# Patient Record
Sex: Female | Born: 1940 | Race: White | Hispanic: No | Marital: Married | Smoking: Never smoker
Health system: Southern US, Community
[De-identification: ages and names within clinical notes are randomized; demographics above are authoritative.]

## PROBLEM LIST (undated history)

## (undated) DIAGNOSIS — K219 Gastro-esophageal reflux disease without esophagitis: Secondary | ICD-10-CM

## (undated) DIAGNOSIS — E876 Hypokalemia: Secondary | ICD-10-CM

## (undated) DIAGNOSIS — F0390 Unspecified dementia without behavioral disturbance: Secondary | ICD-10-CM

## (undated) DIAGNOSIS — F419 Anxiety disorder, unspecified: Secondary | ICD-10-CM

## (undated) DIAGNOSIS — I10 Essential (primary) hypertension: Secondary | ICD-10-CM

## (undated) DIAGNOSIS — F32A Depression, unspecified: Secondary | ICD-10-CM

## (undated) DIAGNOSIS — F329 Major depressive disorder, single episode, unspecified: Secondary | ICD-10-CM

## (undated) DIAGNOSIS — S72009A Fracture of unspecified part of neck of unspecified femur, initial encounter for closed fracture: Secondary | ICD-10-CM

---

## 1998-04-02 ENCOUNTER — Ambulatory Visit (HOSPITAL_COMMUNITY): Admission: RE | Admit: 1998-04-02 | Discharge: 1998-04-02 | Payer: Self-pay

## 1998-04-12 ENCOUNTER — Other Ambulatory Visit: Admission: RE | Admit: 1998-04-12 | Discharge: 1998-04-12 | Payer: Self-pay | Admitting: Gynecology

## 1999-05-21 ENCOUNTER — Ambulatory Visit (HOSPITAL_COMMUNITY): Admission: RE | Admit: 1999-05-21 | Discharge: 1999-05-21 | Payer: Self-pay | Admitting: *Deleted

## 2000-05-21 ENCOUNTER — Ambulatory Visit (HOSPITAL_COMMUNITY): Admission: RE | Admit: 2000-05-21 | Discharge: 2000-05-21 | Payer: Self-pay

## 2000-08-11 ENCOUNTER — Ambulatory Visit (HOSPITAL_COMMUNITY): Admission: RE | Admit: 2000-08-11 | Discharge: 2000-08-11 | Payer: Self-pay | Admitting: Gastroenterology

## 2001-06-23 ENCOUNTER — Ambulatory Visit (HOSPITAL_COMMUNITY): Admission: RE | Admit: 2001-06-23 | Discharge: 2001-06-23 | Payer: Self-pay | Admitting: Obstetrics & Gynecology

## 2002-08-29 ENCOUNTER — Ambulatory Visit (HOSPITAL_COMMUNITY): Admission: RE | Admit: 2002-08-29 | Discharge: 2002-08-29 | Payer: Self-pay | Admitting: *Deleted

## 2006-12-29 ENCOUNTER — Encounter (HOSPITAL_BASED_OUTPATIENT_CLINIC_OR_DEPARTMENT_OTHER): Admission: RE | Admit: 2006-12-29 | Discharge: 2007-01-26 | Payer: Self-pay | Admitting: Surgery

## 2007-02-01 ENCOUNTER — Encounter (HOSPITAL_BASED_OUTPATIENT_CLINIC_OR_DEPARTMENT_OTHER): Admission: RE | Admit: 2007-02-01 | Discharge: 2007-05-02 | Payer: Self-pay | Admitting: Surgery

## 2008-01-27 ENCOUNTER — Emergency Department (HOSPITAL_COMMUNITY): Admission: EM | Admit: 2008-01-27 | Discharge: 2008-01-27 | Payer: Self-pay | Admitting: Emergency Medicine

## 2008-08-05 ENCOUNTER — Emergency Department (HOSPITAL_COMMUNITY): Admission: EM | Admit: 2008-08-05 | Discharge: 2008-08-05 | Payer: Self-pay | Admitting: Emergency Medicine

## 2009-11-23 ENCOUNTER — Emergency Department (HOSPITAL_COMMUNITY): Admission: EM | Admit: 2009-11-23 | Discharge: 2009-11-23 | Payer: Self-pay | Admitting: Emergency Medicine

## 2010-05-06 LAB — CBC
HCT: 35.9 % — ABNORMAL LOW (ref 36.0–46.0)
Hemoglobin: 11.8 g/dL — ABNORMAL LOW (ref 12.0–15.0)
MCV: 89.4 fL (ref 78.0–100.0)
RDW: 13.5 % (ref 11.5–15.5)

## 2010-05-06 LAB — COMPREHENSIVE METABOLIC PANEL
Alkaline Phosphatase: 78 U/L (ref 39–117)
BUN: 10 mg/dL (ref 6–23)
Creatinine, Ser: 0.78 mg/dL (ref 0.4–1.2)
Glucose, Bld: 94 mg/dL (ref 70–99)
Potassium: 3.3 mEq/L — ABNORMAL LOW (ref 3.5–5.1)
Total Bilirubin: 0.5 mg/dL (ref 0.3–1.2)
Total Protein: 7 g/dL (ref 6.0–8.3)

## 2010-05-06 LAB — DIFFERENTIAL
Basophils Absolute: 0.1 10*3/uL (ref 0.0–0.1)
Basophils Relative: 1 % (ref 0–1)
Eosinophils Absolute: 0.1 10*3/uL (ref 0.0–0.7)
Lymphocytes Relative: 33 % (ref 12–46)
Monocytes Relative: 8 % (ref 3–12)
Neutrophils Relative %: 57 % (ref 43–77)

## 2010-05-06 LAB — URINALYSIS, ROUTINE W REFLEX MICROSCOPIC
Glucose, UA: NEGATIVE mg/dL
Hgb urine dipstick: NEGATIVE
Ketones, ur: 15 mg/dL — AB
Protein, ur: NEGATIVE mg/dL

## 2010-05-06 LAB — URINE CULTURE
Colony Count: NO GROWTH
Culture: NO GROWTH

## 2010-06-04 NOTE — Assessment & Plan Note (Signed)
[facility]   NAME:  Jillian King, Jillian King                ACCOUNT NO.:  [id number]   MEDICAL RECORD NO.:  [id number]      DATE OF BIRTH:  May 09, 1940   PHYSICIAN:  Theresia Majors. Tanda Rockers, M.D. VISIT DATE:  12/30/2006                                   OFFICE VISIT   REASON FOR CONSULTATION:  Jillian King is a 70 year old lady who was  referred by Dr. Rudi Heap for recurrent MRSA infections over the  buttocks and the right lower extremity.   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   RECOMMENDATIONS:  Will change the patient's bathing technique to include  a tub bath daily, thereafter with rinse of Dakin's solution.  We will  reevaluate the patient in 2 weeks to reassess her response to therapy.   SUBJECTIVE:  Jillian King is a 70 year old lady who is currently living  with her daughter-in-law.  Over the past 3-4 weeks, she has had  recurrent pustules on both buttocks, and most recently on the posterior  right lower extremity.  These pustules have grown MRSA in the past, but  most recently were negative for MRSA following a course of doxycycline,  Septra and most recently rifampin.  She has not been febrile.  She  continues to be ambulatory.  Her diet is poor according to her daughter.   ALLERGIES:  The patient denies allergies.   CURRENT MEDICATIONS:  1. Lexapro 40 mg daily.  2. Aricept 5 mg daily.  3. Namenda 10 mg b.i.d.  4. Risperdal 1 mg t.i.d.  5. Xanax 0.5 mg t.i.d.  6. Tylenol p.r.n.  7. Restoril 30 mg q.h.s. p.r.n.   PREVIOUS SURGERY:  1. Hysterectomy.  2. Appendectomy.   FAMILY HISTORY:  Positive for vascular disease, cancer and diabetes.   SOCIAL HISTORY:  She is divorced.  She has 4 children who live in the  local area.  She is retired from [employer].   REVIEW OF SYSTEMS:  She specifically denies chest pain.  Chronic cough.  She has occasional constipation.   There has been no dysuria.  She has  lost approximately 10 pounds over the last year due to poor appetite.  She is currently being evaluated by a mental health officer for dementia  and symptomatology related to both dementia, as well as depression.  The  remainder of the review of systems is negative.   PHYSICAL EXAMINATION:  GENERAL:  She is a well-developed female in no  acute distress.  She is accompanied by her daughter-in-law.  VITAL SIGNS:  Blood pressure is 109/64, respirations 16, pulse rate 72,  temperature is 98.4.  HEENT:  Clear.  NECK:  Supple.  Trachea is midline.  Thyroid is nonpalpable.  LUNGS:  Clear.  ABDOMEN:  Soft.  EXTREMITIES:  Remarkable for firm eschars on the posterior gastroc  soleus area of the right lower extremity.  There is minimum erythema.  There is no fluctuance.  BUTTOCKS:  There are 3 separate areas of eschar surrounded by minimum  erythema and absolutely no drainage.  There is a full-thickness area  that is moist but a debridement was not indicated.  The pedal pulses are  3+, readily palpable.  On the posterior aspect of the  right lower  extremity, several areas of well-formed eschar are present.  There is no  evidence of lymphangitis or abscess formation.  NEUROLOGIC:  The patient moves all fours.   DISCUSSION:  Jillian King is a 70 year old lady who reports a history of  these recurrent pustules which have been positive for methicillin-  resistant Staphylococcus aureus.  We have counseled the patient and her  daughter-in-law that the mainstay of the prevention of MRSA is  antiseptic soap wash.  We have instructed the patient and her daughter-  in-law to use a daily shower with proprietary antiseptic soap followed  by a mixture of Dakin's solution to be made using 1 tablespoon of Clorox  in one quart of tap water.  This solution will be used as a rinse of all  of the areas following the daily bath.  She will continue this for 2  weeks and be  reevaluated at the wound center.  If in the event she  develops increasing redness or pain in these areas, she should call the  clinic for further instructions and to be reevaluated.  We have given  the patient and the daughter-in-law an opportunity to ask questions.  They express an understanding of these instructions and indicate that  they will be compliant.  They express gratitude for having been seen in  the clinic.      Harold A. Tanda Rockers, M.D.  Electronically Signed     HAN/MEDQ  D:  12/30/2006  T:  12/31/2006  Job:  [id number]   cc:   Ernestina Penna, M.D.

## 2010-06-04 NOTE — Assessment & Plan Note (Signed)
[facility]   NAME:  Jillian King, Jillian King                ACCOUNT NO.:  [id number]   MEDICAL RECORD NO.:  [id number]      DATE OF BIRTH:  Sep 19, 1940   PHYSICIAN:  Jonelle Sports. Sevier, M.D.  VISIT DATE:  01/12/2007                                   OFFICE VISIT   WOUND CENTER PROGRESS NOTE:  This is the second visit for this 70-year-  old white female who was seen a week ago for recurring MRSA lesions on  the buttock area and also on the posterior aspect of the right lower  extremity.  At that time it was recommended to her that she simply bathe  with Dial soap and that she rinse herself with a homemade Dakin's  solution, not just areas of involvement, but actually her entire body.  It was explained that she is likely a carrier of the MRSA (to my  knowledge her nostrils have not been cultured) and hopefully this spread  has been simply transcutaneous and not systemic.   The family reports that the lesions are somewhat better and that no new  lesions have appeared with the exception of a small almost blister like  area on the right lower quadrant of the abdomen, almost near McBurney's  point, which is described as, looking like these things always appear  with they are first beginning.  She has had no fever, chills or  systemic symptoms.   PHYSICAL EXAMINATION:  VITAL SIGNS:  Blood pressure is 123/72, pulse 74  regular, respirations 18, and temperature 97.5.  BUTTOCK:  There is one large lesion on the right buttock measuring 1.5 x  2.6 centimeters.  It is somewhat concave in appearance and is totally  filled with eschar slightly crumbly edges.   On the posterior aspect of the right lower leg are several reddened  areas and several scabbed areas of about 3 each with no spreading  erythema.  No swelling and so forth, which again, appear to be stable or  satisfactory.   IMPRESSION:  Multiple cutaneous staphylococcal lesions (MRSA) stable to  slightly improved.   DISPOSITION:  A long conversation was held with the patient and her  daughter-in-law.  They need to understand that we will prefer to void  systemic antibiotics if possible, particularly if it appears that all of  the spread is indeed transcutaneous.  They are also advise that it will  be a very slow healing process, but that the best thing we could do is  to allow that to happen and for neither [place] nor she to, pick at these  areas.   Accordingly she was advised to continue her daily cleansing with  antibiotic soap and is to then rinse her entire body with a homemade  Dakin's solution as well.   The daughter is concerned about her itching at night.  It is recommend  that she put A&D ointment on these areas of these lesions, cover them  with a small piece of saran warp and hold that in place with paper tape  in the case of the buttock or a circumferential Kling wrap in the case  of the leg.  This hopefully will keep her from further irritating it.   In addition, it is recommended that they  consider getting some soft  white cotton gloves for her to wear at night, and if necessary, if her  tendency to pick at her nose and the scratch is great during the day is  to actually consider using them during the day as well.   The tiny area on the right lower abdominal wall is investigated, does  appear to be loosening of the outer layer of the skin, in sort of a  blister-like formation, although, there is no tense fluid accumulation.  There is no surrounding erythema.  My recommendation at this point is  simply to observe this lesion over time as well.   Return visit here will be in 3 weeks.           ______________________________  Jonelle Sports Cheryll Cockayne, M.D.     RES/MEDQ  D:  01/12/2007  T:  01/12/2007  Job:  [id number]

## 2010-08-16 ENCOUNTER — Emergency Department (HOSPITAL_COMMUNITY)
Admission: EM | Admit: 2010-08-16 | Discharge: 2010-08-16 | Disposition: A | Discharge status: Home / Self Care | Payer: Medicare Other | Attending: Emergency Medicine | Admitting: Emergency Medicine

## 2010-08-16 ENCOUNTER — Emergency Department (HOSPITAL_COMMUNITY): Payer: Medicare Other

## 2010-08-16 DIAGNOSIS — Y9210 Unspecified residential institution as the place of occurrence of the external cause: Secondary | ICD-10-CM | POA: Insufficient documentation

## 2010-08-16 DIAGNOSIS — G309 Alzheimer's disease, unspecified: Secondary | ICD-10-CM | POA: Insufficient documentation

## 2010-08-16 DIAGNOSIS — F341 Dysthymic disorder: Secondary | ICD-10-CM | POA: Insufficient documentation

## 2010-08-16 DIAGNOSIS — F0280 Dementia in other diseases classified elsewhere without behavioral disturbance: Secondary | ICD-10-CM | POA: Insufficient documentation

## 2010-08-16 DIAGNOSIS — Z79899 Other long term (current) drug therapy: Secondary | ICD-10-CM | POA: Insufficient documentation

## 2010-08-16 DIAGNOSIS — K219 Gastro-esophageal reflux disease without esophagitis: Secondary | ICD-10-CM | POA: Insufficient documentation

## 2010-08-16 DIAGNOSIS — Z7982 Long term (current) use of aspirin: Secondary | ICD-10-CM | POA: Insufficient documentation

## 2010-08-16 DIAGNOSIS — W19XXXA Unspecified fall, initial encounter: Secondary | ICD-10-CM | POA: Insufficient documentation

## 2010-11-01 ENCOUNTER — Emergency Department (HOSPITAL_COMMUNITY)
Admission: EM | Admit: 2010-11-01 | Discharge: 2010-11-01 | Disposition: A | Discharge status: Home / Self Care | Payer: Medicare Other | Attending: Emergency Medicine | Admitting: Emergency Medicine

## 2010-11-01 DIAGNOSIS — W19XXXA Unspecified fall, initial encounter: Secondary | ICD-10-CM | POA: Insufficient documentation

## 2010-11-01 DIAGNOSIS — Y9210 Unspecified residential institution as the place of occurrence of the external cause: Secondary | ICD-10-CM | POA: Insufficient documentation

## 2010-11-01 DIAGNOSIS — K219 Gastro-esophageal reflux disease without esophagitis: Secondary | ICD-10-CM | POA: Insufficient documentation

## 2010-11-01 DIAGNOSIS — F0390 Unspecified dementia without behavioral disturbance: Secondary | ICD-10-CM | POA: Insufficient documentation

## 2010-11-01 HISTORY — DX: Anxiety disorder, unspecified: F41.9

## 2010-11-01 HISTORY — DX: Gastro-esophageal reflux disease without esophagitis: K21.9

## 2010-11-01 HISTORY — DX: Hypokalemia: E87.6

## 2010-11-01 HISTORY — DX: Fracture of unspecified part of neck of unspecified femur, initial encounter for closed fracture: S72.009A

## 2010-11-01 HISTORY — DX: Major depressive disorder, single episode, unspecified: F32.9

## 2010-11-01 HISTORY — DX: Unspecified dementia, unspecified severity, without behavioral disturbance, psychotic disturbance, mood disturbance, and anxiety: F03.90

## 2010-11-01 HISTORY — DX: Depression, unspecified: F32.A

## 2010-11-01 NOTE — ED Provider Notes (Signed)
Scribed for [employer]. Rubin Payor, MD, the patient was seen in room APA03/APA03. This chart was scribed by [employer]. The patient's care started at 17:49  CSN: [id number] Arrival date & time: 11/01/2010  4:34 PM  Chief Complaint  Patient presents with  . Fall   HPI Level 5 Caveat for Dementia Jillian King is a 70 y.o. female who presents to the Emergency Department complaining of a fall. Per Nurse's notes, patient was brought to the ER from the nursing home with complaint of a fall. Collection of patient history limited by dementia.  Past Medical History  Diagnosis Date  . Dementia   . GERD (gastroesophageal reflux disease)   . Hypokalemia   . Hip fracture   . Anxiety   . Depression     No past surgical history on file.  No family history on file.  History  Substance Use Topics  . Smoking status: Not on file  . Smokeless tobacco: Not on file  . Alcohol Use:     OB History    Grav Para Term Preterm Abortions TAB SAB Ect Mult Living                  Review of Systems  All other systems reviewed and are negative.    Allergies  Review of patient's allergies indicates no known allergies.  Home Medications  No current outpatient prescriptions on file.  BP 152/103  Pulse 75  Resp 20  Physical Exam  Nursing note and vitals reviewed. Constitutional: She appears well-developed and well-nourished. No distress.       Awake, alert, nontoxic appearance.  HENT:  Head: Normocephalic and atraumatic.  Mouth/Throat: Oropharynx is clear and moist. No oropharyngeal exudate.  Eyes: Conjunctivae are normal. Right eye exhibits no discharge. Left eye exhibits no discharge.  Neck: Neck supple. No tracheal deviation present.  Cardiovascular: Normal rate and normal heart sounds.   No murmur heard. Pulmonary/Chest: Effort normal. She exhibits no tenderness.  Abdominal: Soft. There is no tenderness. There is no rebound and no guarding.  Musculoskeletal: She exhibits no  tenderness.       Baseline ROM, no obvious new focal weakness.  Lymphadenopathy:    She has no cervical adenopathy.  Neurological: She is alert.       Demented but pleasant  Skin: Skin is warm and dry. No rash noted.  Psychiatric: She has a normal mood and affect.    ED Course  Procedures     COORDINATION OF CARE:  18:10 - EDP examined patient at bedside and ordered the following No orders of the defined types were placed in this encounter.      I personally performed the services described in this documentation, which was scribed in my presence. The recorded information has been reviewed and considered.  Patient was sent in from the nursing home for a fall. Was an unwitnessed fall. She is reportedly at her baseline dementia there is no evidence of trauma on her. there's no bruising or deformity to her head , or tenderness to her neck or back. She is moving all extremities doesn't appear that she needs imaging right now. she is not on any blood thinners. She will be discharged back to the nursing home.  Juliet Rude. Rubin Payor, MD 11/01/10 1816

## 2010-11-01 NOTE — ED Notes (Signed)
Pt left with no stated needs with nh staff

## 2010-11-01 NOTE — ED Notes (Signed)
Staff sitting with pt at this time. nad noted.

## 2010-11-01 NOTE — ED Notes (Signed)
Pt is still waiting to be seen by provider. NT at bedside sitting with pt for safety.

## 2010-11-01 NOTE — ED Notes (Signed)
Spent 20 minutes attempting to get someone from [place] [facility] to pick up patient. Spoke with staff member and they stated it would be 1 1/2 hours before anyone could come and get pt because supervisor lives in [place] and would have to come and get the transportation vehicle first. I explained that we needed a number that we could get in touch with staff in case of emergency in future. Staff member refused to give additional number and stated no one could be here any sooner to get pt.

## 2010-11-01 NOTE — ED Notes (Signed)
Pt was brought from [facility] for fall. Staff unsure what pt hit when she fell. Un witnessed. Pt has dementia and non cooperative per nursing home report. Pt attempted to get oob at this time. Charge made aware for sitter for pt.

## 2010-12-02 ENCOUNTER — Encounter (HOSPITAL_COMMUNITY): Payer: Self-pay

## 2010-12-02 ENCOUNTER — Other Ambulatory Visit: Payer: Self-pay

## 2010-12-02 ENCOUNTER — Emergency Department (HOSPITAL_COMMUNITY)
Admission: EM | Admit: 2010-12-02 | Discharge: 2010-12-03 | Disposition: A | Discharge status: Home / Self Care | Payer: Medicare Other | Attending: Emergency Medicine | Admitting: Emergency Medicine

## 2010-12-02 DIAGNOSIS — R1110 Vomiting, unspecified: Secondary | ICD-10-CM | POA: Insufficient documentation

## 2010-12-02 DIAGNOSIS — F0390 Unspecified dementia without behavioral disturbance: Secondary | ICD-10-CM | POA: Insufficient documentation

## 2010-12-02 DIAGNOSIS — N390 Urinary tract infection, site not specified: Secondary | ICD-10-CM | POA: Insufficient documentation

## 2010-12-02 DIAGNOSIS — R55 Syncope and collapse: Secondary | ICD-10-CM | POA: Insufficient documentation

## 2010-12-02 DIAGNOSIS — K219 Gastro-esophageal reflux disease without esophagitis: Secondary | ICD-10-CM | POA: Insufficient documentation

## 2010-12-02 LAB — URINE MICROSCOPIC-ADD ON

## 2010-12-02 LAB — URINALYSIS, ROUTINE W REFLEX MICROSCOPIC
Glucose, UA: NEGATIVE mg/dL
Protein, ur: NEGATIVE mg/dL
Specific Gravity, Urine: 1.012 (ref 1.005–1.030)
pH: 7 (ref 5.0–8.0)

## 2010-12-02 LAB — POCT I-STAT, CHEM 8
Calcium, Ion: 1.24 mmol/L (ref 1.12–1.32)
Chloride: 104 mEq/L (ref 96–112)
HCT: 35 % — ABNORMAL LOW (ref 36.0–46.0)
Potassium: 4.4 mEq/L (ref 3.5–5.1)
Sodium: 142 mEq/L (ref 135–145)

## 2010-12-02 NOTE — ED Notes (Signed)
Patient continues on monitor, patient was ambulated to bathroom.  Patient now back in bed.

## 2010-12-02 NOTE — ED Notes (Signed)
Patient with history of dementia, did have CPR done on her after having a syncopal episode.  Patient is now per baseline due to dementia.

## 2010-12-02 NOTE — ED Notes (Signed)
Pt from [facility] assisted living for near syncopal episode on commode and vomited and diarrhea found unresponsive and staff started CPR pt awake and normal per EMS on arrival.  No complaints per pt.

## 2010-12-03 LAB — TROPONIN I: Troponin I: <0.3 ng/mL (ref <0.30)

## 2010-12-03 MED ORDER — CEFUROXIME AXETIL 500 MG PO TABS: 500.0000 mg | ORAL_TABLET | Freq: Two times a day (BID) | ORAL | Status: AC

## 2010-12-03 MED ORDER — CEFUROXIME AXETIL 500 MG PO TABS
500.0000 mg | ORAL_TABLET | Freq: Two times a day (BID) | ORAL | Status: DC
  Administered 2010-12-03: 500 mg via ORAL
  Filled 2010-12-03 (×2): qty 1

## 2010-12-03 NOTE — ED Notes (Signed)
Report called to [facility].  Patient to have a ride come and

## 2010-12-03 NOTE — ED Notes (Signed)
Patient discharged in care of drivers for [facility] home

## 2010-12-03 NOTE — ED Provider Notes (Signed)
History     CSN: [id number] Arrival date & time: 12/02/2010  7:39 PM   First MD Initiated Contact with Patient 12/02/10 2031      Chief Complaint  Patient presents with  . Near Syncope   level VCaveat patient demented history is obtained via telephone from Ms. Aundra Millet, medical technician an assisted-living facility. Patient had a syncopal event tonight during which time she was unconscious for 2-3 minutes. Upon reawakening she vomited one time. Presently patient denies pain anywhere  (Consider location/radiation/quality/duration/timing/severity/associated sxs/prior treatment) HPI  Past Medical History  Diagnosis Date  . Dementia   . GERD (gastroesophageal reflux disease)   . Hypokalemia   . Hip fracture   . Anxiety   . Depression     History reviewed. No pertinent past surgical history.  History reviewed. No pertinent family history.  History  Substance Use Topics  . Smoking status: Not on file  . Smokeless tobacco: Not on file  . Alcohol Use:     OB History    Grav Para Term Preterm Abortions TAB SAB Ect Mult Living                  Review of Systems  Unable to perform ROS: Dementia    Allergies  Review of patient's allergies indicates no known allergies.  Home Medications   Current Outpatient Rx  Name Route Sig Dispense Refill  . ACETAMINOPHEN 160 MG/5ML PO SUSP Oral Take 960 mg/kg by mouth 2 (two) times daily.      Marland Kitchen ALPRAZOLAM 0.5 MG PO TABS Oral Take 0.5 mg by mouth 3 (three) times daily.      . ASPIRIN 81 MG PO CHEW Oral Chew 81 mg by mouth daily.      . BUSPIRONE HCL 5 MG PO TABS Oral Take 5 mg by mouth 3 (three) times daily.      Marland Kitchen CALCIUM CARB-CHOLECALCIFEROL 500-600 MG-UNIT PO CHEW Oral Chew 1 each by mouth 3 (three) times daily.      Marland Kitchen DIVALPROEX SODIUM 125 MG PO CPSP Oral Take 250 mg by mouth 2 (two) times daily. *May open and sprinkle capsule on food*     . MELATONIN 5 MG PO TABS Oral Take 1 tablet by mouth at bedtime.      . MELOXICAM 7.5  MG PO TABS Oral Take 7.5 mg by mouth at bedtime.      . MULTIVITAMIN & MINERAL PO LIQD Oral Take 5 mLs by mouth daily.      Marland Kitchen CARNATION INST BREAKFAST JUICE PO Oral Take 1 Can by mouth 2 (two) times daily.      Marland Kitchen OMEPRAZOLE 20 MG PO CPDR Oral Take 20 mg by mouth daily. *Open and sprinkle capsule in food*     . POLYETHYLENE GLYCOL 3350 PO POWD Oral Take 17 g by mouth 2 (two) times a week. On TUESDAYS and FRIDAYS *Mix one capful (17g) in water*     . RIVASTIGMINE TARTRATE 3 MG PO CAPS Oral Take 3 mg by mouth 2 (two) times daily.      . TRAZODONE HCL 150 MG PO TABS Oral Take 150 mg by mouth at bedtime.        BP 113/64  Pulse 68  Resp 18  SpO2 99%  Physical Exam  Nursing note and vitals reviewed. Constitutional:       Pleasantly confused no distress  HENT:  Head: Normocephalic and atraumatic.       Edentulous  Eyes: EOM are normal.  Neck: Neck supple. No tracheal deviation present. No thyromegaly present.  Cardiovascular: Normal rate and regular rhythm.   No murmur heard. Pulmonary/Chest: Effort normal and breath sounds normal.  Abdominal: Soft. Bowel sounds are normal. She exhibits no distension. There is no tenderness.  Genitourinary: Guaiac negative stool.  Musculoskeletal: Normal range of motion. She exhibits no edema and no tenderness.  Neurological: She is alert. Coordination normal.       Moves all extremities  Skin: Skin is warm and dry. No rash noted.    ED Course  Procedures (including critical care time)  Labs Reviewed  URINALYSIS, ROUTINE W REFLEX MICROSCOPIC - Abnormal; Notable for the following:    Appearance TURBID (*)    Hgb urine dipstick LARGE (*)    Leukocytes, UA LARGE (*)    All other components within normal limits  POCT I-STAT, CHEM 8 - Abnormal; Notable for the following:    Glucose, Bld 104 (*)    Hemoglobin 11.9 (*)    HCT 35.0 (*)    All other components within normal limits  URINE MICROSCOPIC-ADD ON - Abnormal; Notable for the following:     Squamous Epithelial / LPF FEW (*)    Bacteria, UA MANY (*)    All other components within normal limits  OCCULT BLOOD X 1 CARD TO LAB, STOOL  I-STAT, CHEM 8  TROPONIN I   No results found.   No diagnosis found.   Date: 12/03/2010  Rate: 75  Rhythm: normal sinus rhythm  QRS Axis: normal  Intervals: normal  ST/T Wave abnormalities: normal  Conduction Disutrbances:none  Narrative Interpretation:   Old EKG Reviewed: none available  Results for orders placed during the hospital encounter of 12/02/10  URINALYSIS, ROUTINE W REFLEX MICROSCOPIC      Component Value Range   Color, Urine YELLOW  YELLOW    Appearance TURBID (*) CLEAR    Specific Gravity, Urine 1.012  1.005 - 1.030    pH 7.0  5.0 - 8.0    Glucose, UA NEGATIVE  NEGATIVE (mg/dL)   Hgb urine dipstick LARGE (*) NEGATIVE    Bilirubin Urine NEGATIVE  NEGATIVE    Ketones, ur NEGATIVE  NEGATIVE (mg/dL)   Protein, ur NEGATIVE  NEGATIVE (mg/dL)   Urobilinogen, UA 0.2  0.0 - 1.0 (mg/dL)   Nitrite NEGATIVE  NEGATIVE    Leukocytes, UA LARGE (*) NEGATIVE   TROPONIN I      Component Value Range   Troponin I <0.30  <0.30 (ng/mL)  POCT I-STAT, CHEM 8      Component Value Range   Sodium 142  135 - 145 (mEq/L)   Potassium 4.4  3.5 - 5.1 (mEq/L)   Chloride 104  96 - 112 (mEq/L)   BUN 16  6 - 23 (mg/dL)   Creatinine, Ser 2.95  0.50 - 1.10 (mg/dL)   Glucose, Bld 621 (*) 70 - 99 (mg/dL)   Calcium, Ion 3.08  6.57 - 1.32 (mmol/L)   TCO2 28  0 - 100 (mmol/L)   Hemoglobin 11.9 (*) 12.0 - 15.0 (g/dL)   HCT 84.6 (*) 96.2 - 46.0 (%)  URINE MICROSCOPIC-ADD ON      Component Value Range   Squamous Epithelial / LPF FEW (*) RARE    WBC, UA TOO NUMEROUS TO COUNT  <3 (WBC/hpf)   RBC / HPF 7-10  <3 (RBC/hpf)   Bacteria, UA MANY (*) RARE    ED course tempted to call patient's sonMr. JohnChenausky at 11:15 PM, no answer. Message left  on answering machine  No results found. MDM  Plan prescription Ceftin, urine sent for culture Diagnosis #1  syncope #2 UTI        Doug Sou, MD 12/03/10 [phone]

## 2010-12-04 LAB — URINE CULTURE: Colony Count: >=100000

## 2010-12-05 NOTE — ED Notes (Signed)
+   Urine Patient treated with Keflex-sensitive to same-chart appended per protocol MD. 

## 2011-07-15 ENCOUNTER — Emergency Department (HOSPITAL_COMMUNITY)
Admission: EM | Admit: 2011-07-15 | Discharge: 2011-07-15 | Disposition: A | Discharge status: Home / Self Care | Payer: Medicare Other | Attending: Emergency Medicine | Admitting: Emergency Medicine

## 2011-07-15 ENCOUNTER — Encounter (HOSPITAL_COMMUNITY): Payer: Self-pay

## 2011-07-15 DIAGNOSIS — K529 Noninfective gastroenteritis and colitis, unspecified: Secondary | ICD-10-CM

## 2011-07-15 DIAGNOSIS — K219 Gastro-esophageal reflux disease without esophagitis: Secondary | ICD-10-CM | POA: Insufficient documentation

## 2011-07-15 DIAGNOSIS — R1110 Vomiting, unspecified: Secondary | ICD-10-CM | POA: Insufficient documentation

## 2011-07-15 DIAGNOSIS — F0390 Unspecified dementia without behavioral disturbance: Secondary | ICD-10-CM | POA: Insufficient documentation

## 2011-07-15 DIAGNOSIS — R197 Diarrhea, unspecified: Secondary | ICD-10-CM | POA: Insufficient documentation

## 2011-07-15 MED ORDER — ONDANSETRON HCL 4 MG/2ML IJ SOLN
4.0000 mg | Freq: Once | INTRAMUSCULAR | Status: AC
  Administered 2011-07-15: 4 mg via INTRAVENOUS
  Filled 2011-07-15: qty 2

## 2011-07-15 MED ORDER — SODIUM CHLORIDE 0.9 % IV BOLUS (SEPSIS): 500.0000 mL | Freq: Once | INTRAVENOUS | Status: DC

## 2011-07-15 NOTE — ED Provider Notes (Signed)
This chart was scribed for Donnetta Hutching, MD by Wallis Mart. The patient was seen in room APAH8/APAH8 and the patient's care was started at 9:08 PM.  CSN: [id number]  Arrival date & time 07/15/11  2029   First MD Initiated Contact with Patient 07/15/11 2049      Chief Complaint  Patient presents with  . Emesis  . Diarrhea    (Consider location/radiation/quality/duration/timing/severity/associated sxs/prior treatment) HPI  Level 5 Caveat for dementia   Jillian King is a 71 y.o. female who presents to the Emergency Department via EMS complaining of one episode of vomiting and diarrhea today per nursing notes. Pt is a resident of [facility] and does not know why she is in the ED, does not appear distressed or uncomfortable There are no other associated symptoms and no other alleviating or aggravating factors.   Past Medical History  Diagnosis Date  . Dementia   . GERD (gastroesophageal reflux disease)   . Hypokalemia   . Hip fracture   . Anxiety   . Depression     History reviewed. No pertinent past surgical history.  No family history on file.  History  Substance Use Topics  . Smoking status: Never Smoker   . Smokeless tobacco: Not on file  . Alcohol Use: No    OB History    Grav Para Term Preterm Abortions TAB SAB Ect Mult Living                  Review of Systems  10 Systems reviewed and all are negative for acute change except as noted in the HPI.    Allergies  Review of patient's allergies indicates no known allergies.  Home Medications   Current Outpatient Rx  Name Route Sig Dispense Refill  . ACETAMINOPHEN 160 MG/5ML PO SUSP Oral Take 960 mg/kg by mouth 2 (two) times daily.      Marland Kitchen ALPRAZOLAM 0.5 MG PO TABS Oral Take 0.5 mg by mouth 3 (three) times daily.      . ASPIRIN 81 MG PO CHEW Oral Chew 81 mg by mouth daily.      . BUSPIRONE HCL 5 MG PO TABS Oral Take 5 mg by mouth 3 (three) times daily.      Marland Kitchen CALCIUM  CARB-CHOLECALCIFEROL 500-600 MG-UNIT PO CHEW Oral Chew 1 each by mouth 3 (three) times daily.      Marland Kitchen DIVALPROEX SODIUM 125 MG PO CPSP Oral Take 250 mg by mouth 2 (two) times daily. *May open and sprinkle capsule on food*     . MELATONIN 5 MG PO TABS Oral Take 1 tablet by mouth at bedtime.      . MELOXICAM 7.5 MG PO TABS Oral Take 7.5 mg by mouth at bedtime.      . MULTIVITAMIN & MINERAL PO LIQD Oral Take 5 mLs by mouth daily.      Marland Kitchen CARNATION INST BREAKFAST JUICE PO Oral Take 1 Can by mouth 2 (two) times daily.      Marland Kitchen OMEPRAZOLE 20 MG PO CPDR Oral Take 20 mg by mouth daily. *Open and sprinkle capsule in food*     . POLYETHYLENE GLYCOL 3350 PO POWD Oral Take 17 g by mouth 2 (two) times a week. On TUESDAYS and FRIDAYS *Mix one capful (17g) in water*     . RIVASTIGMINE TARTRATE 3 MG PO CAPS Oral Take 3 mg by mouth 2 (two) times daily.      . TRAZODONE HCL 150 MG PO TABS  Oral Take 150 mg by mouth at bedtime.        BP 103/66  Pulse 52  Temp 97.6 F (36.4 C) (Axillary)  Resp 18  SpO2 96%  Physical Exam  Nursing note and vitals reviewed. Constitutional: She is oriented to person, place, and time. She appears well-developed and well-nourished. No distress.  HENT:  Head: Normocephalic and atraumatic.  Eyes: EOM are normal. Pupils are equal, round, and reactive to light.  Neck: Normal range of motion. Neck supple. No tracheal deviation present.  Cardiovascular: Normal rate and regular rhythm.   Pulmonary/Chest: Effort normal and breath sounds normal. No respiratory distress.  Abdominal: Soft. Bowel sounds are normal. She exhibits no distension. There is no tenderness.  Musculoskeletal: Normal range of motion. She exhibits no edema.  Neurological: She is alert and oriented to person, place, and time. No sensory deficit.  Skin: Skin is warm and dry.  Psychiatric: She has a normal mood and affect. Her behavior is normal.    ED Course  Procedures (including critical care time)  DIAGNOSTIC  STUDIES: Oxygen Saturation is 96% on room air, normal by my interpretation.    COORDINATION OF CARE:  9:10: Pt to be given IV fluids and Zofran  Labs Reviewed - No data to display No results found.   No diagnosis found.    MDM  Patient's smiling, no acute abdomen. IV fluids given along with Zofran. She appears nontoxic.   I personally performed the services described in this documentation, which was scribed in my presence. The recorded information has been reviewed and considered.         Donnetta Hutching, MD 07/15/11 2227

## 2011-07-15 NOTE — Discharge Instructions (Signed)
Mrs. Jillian King had no episodes of vomiting or diarrhea in the emergency department. IV fluids were given along with Zofran. Followup with her primary care doctor

## 2011-07-15 NOTE — ED Notes (Signed)
Pt from [facility] facility with c/o vomiting and diarrhea onset today.

## 2011-09-29 ENCOUNTER — Encounter (HOSPITAL_COMMUNITY): Payer: Self-pay | Admitting: Emergency Medicine

## 2011-09-29 ENCOUNTER — Emergency Department (HOSPITAL_COMMUNITY)
Admission: EM | Admit: 2011-09-29 | Discharge: 2011-09-30 | Disposition: A | Discharge status: Home / Self Care | Payer: Medicare Other | Attending: Emergency Medicine | Admitting: Emergency Medicine

## 2011-09-29 DIAGNOSIS — F0390 Unspecified dementia without behavioral disturbance: Secondary | ICD-10-CM | POA: Insufficient documentation

## 2011-09-29 DIAGNOSIS — K219 Gastro-esophageal reflux disease without esophagitis: Secondary | ICD-10-CM | POA: Insufficient documentation

## 2011-09-29 DIAGNOSIS — R4182 Altered mental status, unspecified: Secondary | ICD-10-CM | POA: Insufficient documentation

## 2011-09-29 LAB — GLUCOSE, CAPILLARY: Glucose-Capillary: 106 mg/dL — ABNORMAL HIGH (ref 70–99)

## 2011-09-29 NOTE — ED Notes (Signed)
CBG Result: 106

## 2011-09-29 NOTE — ED Notes (Signed)
Patient sent from [facility] via EMS with c/o lethargy.  Patient was given Exelon 3mg , Trazodone 150mg  and Depakote 250mg  as her bedtime medications.  Staff became concerned because was sleepy.  Patient arouses to verbal stimuli.

## 2011-09-29 NOTE — ED Provider Notes (Signed)
History     CSN: [id number]  Arrival date & time 09/29/11  2103   First MD Initiated Contact with Patient 09/29/11 2207      Chief Complaint  Patient presents with  . Illegal value: [     Patient is a 71 y.o. female presenting with altered mental status. The history is provided by the nursing home. The history is limited by the condition of the patient.  Altered Mental Status This is a chronic problem. The current episode started more than 1 week ago. The problem occurs constantly. The problem has not changed since onset.Nothing aggravates the symptoms. Nothing relieves the symptoms.  Pt presents from nursing facility She has h/o dementia.  Apparently she seemed "lethargic" after being given meds (which included trazodone) She also seemed to difficult to arouse or to stand on her own Her baseline is she is interactive but demented and inappropriate humor.    No other issues were reported.    Past Medical History  Diagnosis Date  . Dementia   . GERD (gastroesophageal reflux disease)   . Hypokalemia   . Hip fracture   . Anxiety   . Depression     History reviewed. No pertinent past surgical history.  No family history on file.  History  Substance Use Topics  . Smoking status: Never Smoker   . Smokeless tobacco: Not on file  . Alcohol Use: No    OB History    Grav Para Term Preterm Abortions TAB SAB Ect Mult Living                  Review of Systems  Unable to perform ROS: Dementia  Psychiatric/Behavioral: Positive for altered mental status.    Allergies  Review of patient's allergies indicates no known allergies.  Home Medications     BP 104/60  Pulse 71  Temp 97.9 F (36.6 C) (Oral)  Resp 18  SpO2 100%  Physical Exam CONSTITUTIONAL: Well developed/well nourished HEAD AND FACE: Normocephalic/atraumatic EYES: EOMI/PERRL ENMT: Mucous membranes moist NECK: supple no meningeal signs SPINE:entire spine nontender CV: S1/S2 noted, no  murmurs/rubs/gallops noted LUNGS: Lungs are clear to auscultation bilaterally, no apparent distress ABDOMEN: soft, nontender, no rebound or guarding GU:no cva tenderness NEURO: Pt is awake/alert, moves all extremitiesx4.  She has no focal motor weakness.  She is awake/alert.  She speaks to me but laughs at inappropriate times.  She will follow commands.  She is not oriented to place/time. She does know her name EXTREMITIES: pulses normal, full ROM SKIN: warm, color normal PSYCH: no abnormalities of mood noted  ED Course  Procedures  Labs Reviewed  GLUCOSE, CAPILLARY - Abnormal; Notable for the following:    Glucose-Capillary 106 (*)     All other components within normal limits   10:37 PM Nurse called nursing home, and confirmed that patient is at baseline and is not lethargic at this time.  Her vitals are unremarkable.  She is in no distress.  I doubt acute neurologic/cardiovascular/infectious event.     MDM  Nursing notes including past medical history and social history reviewed and considered in documentation Labs/vital reviewed and considered         Joya Gaskins, MD 09/29/11 2238

## 2011-09-29 NOTE — ED Notes (Addendum)
Patient stood twice, with assistance, without difficulty. Did not take any steps.

## 2011-11-07 ENCOUNTER — Encounter (HOSPITAL_COMMUNITY): Payer: Self-pay

## 2011-11-07 ENCOUNTER — Emergency Department (HOSPITAL_COMMUNITY): Payer: Medicare Other

## 2011-11-07 ENCOUNTER — Emergency Department (HOSPITAL_COMMUNITY)
Admission: EM | Admit: 2011-11-07 | Discharge: 2011-11-07 | Disposition: A | Discharge status: Skilled Nursing Facility | Payer: Medicare Other | Attending: Emergency Medicine | Admitting: Emergency Medicine

## 2011-11-07 DIAGNOSIS — W19XXXA Unspecified fall, initial encounter: Secondary | ICD-10-CM

## 2011-11-07 DIAGNOSIS — F0390 Unspecified dementia without behavioral disturbance: Secondary | ICD-10-CM | POA: Insufficient documentation

## 2011-11-07 DIAGNOSIS — Y9210 Unspecified residential institution as the place of occurrence of the external cause: Secondary | ICD-10-CM | POA: Insufficient documentation

## 2011-11-07 DIAGNOSIS — W06XXXA Fall from bed, initial encounter: Secondary | ICD-10-CM | POA: Insufficient documentation

## 2011-11-07 DIAGNOSIS — F341 Dysthymic disorder: Secondary | ICD-10-CM | POA: Insufficient documentation

## 2011-11-07 DIAGNOSIS — I10 Essential (primary) hypertension: Secondary | ICD-10-CM | POA: Insufficient documentation

## 2011-11-07 DIAGNOSIS — Z0389 Encounter for observation for other suspected diseases and conditions ruled out: Secondary | ICD-10-CM | POA: Insufficient documentation

## 2011-11-07 HISTORY — DX: Essential (primary) hypertension: I10

## 2011-11-07 NOTE — ED Provider Notes (Signed)
History     CSN: [id number]  Arrival date & time 11/07/11  1840   First MD Initiated Contact with Patient 11/07/11 1850      Chief Complaint  Patient presents with  . Fall    (Consider location/radiation/quality/duration/timing/severity/associated sxs/prior treatment) HPI Comments: Ms. Tresa Endo is a 71 year old female, who lives at [facility] nursing home, who was found on the floor.  Next to her bed.  She was sent for further amount evaluation.  She suffers from dementia and is noncommunicative  Patient is a 71 y.o. female presenting with fall. The history is provided by the nursing home. The history is limited by the absence of a caregiver.  Fall The accident occurred 1 to 2 hours ago. The fall occurred from a bed. She fell from a height of 3 to 5 ft. She landed on a hard floor. The patient is experiencing no pain. There was no entrapment after the fall. There was no alcohol use involved in the accident. Pertinent negatives include no fever.    Past Medical History  Diagnosis Date  . Dementia   . GERD (gastroesophageal reflux disease)   . Hypokalemia   . Hip fracture   . Anxiety   . Depression   . Hypertension     History reviewed. No pertinent past surgical history.  No family history on file.  History  Substance Use Topics  . Smoking status: Never Smoker   . Smokeless tobacco: Not on file  . Alcohol Use: No    OB History    Grav Para Term Preterm Abortions TAB SAB Ect Mult Living                  Review of Systems  Unable to perform ROS: Dementia  Constitutional: Negative for fever.  Neurological: Positive for tremors.    Allergies  Review of patient's allergies indicates no known allergies.  Home Medications     BP 135/111  Pulse 86  Temp 99.9 F (37.7 C) (Oral)  Resp 16  SpO2 99%  Physical Exam  Constitutional: She appears well-developed and well-nourished. She appears distressed.  HENT:  Head: Normocephalic and atraumatic.  Right Ear:  External ear normal.  Eyes: Pupils are equal, round, and reactive to light.  Neck:       In collar  Cardiovascular: Normal rate.   Pulmonary/Chest: Effort normal.  Abdominal: There is no tenderness.  Musculoskeletal: Normal range of motion. She exhibits no tenderness.  Neurological: She is alert.  Skin: No rash noted. No erythema.    ED Course  Procedures (including critical care time)  Labs Reviewed - No data to display Dg Pelvis 1-2 Views  11/07/2011  *RADIOLOGY REPORT*  Clinical Data: Status post fall  PELVIS - 1-2 VIEW  Comparison: None.  Findings: The patient is status post right hip arthroplasty.  There is no evidence for periprosthetic fracture or subluxation.  The femoral component of the right hip arthroplasty device is incompletely visualized.  Degenerative disc disease is noted within the lumbar spine.  IMPRESSION:  1.  No acute findings identified.   Original Report Authenticated By: Rosealee Albee, M.D.    Ct Head Wo Contrast  11/07/2011  *RADIOLOGY REPORT*  Clinical Data:  Unwitnessed fall, dementia  CT HEAD WITHOUT CONTRAST CT CERVICAL SPINE WITHOUT CONTRAST  Technique:  Multidetector CT imaging of the head and cervical spine was performed following the standard protocol without intravenous contrast.  Multiplanar CT image reconstructions of the cervical spine were also generated.  Comparison:  None.  CT HEAD  Findings: No evidence of parenchymal hemorrhage or extra-axial fluid collection. No mass lesion, mass effect, or midline shift.  No CT evidence of acute infarction.  Extensive small vessel ischemic changes.  Severe cortical and central atrophy, advanced for age.  Secondary ventriculomegaly.  The visualized paranasal sinuses are essentially clear. The mastoid air cells are unopacified.  No evidence of calvarial fracture.  IMPRESSION: No evidence of acute intracranial abnormality.  Severe atrophy with secondary ventriculomegaly.  Extensive small vessel ischemic changes.  CT  CERVICAL SPINE  Findings: Normal cervical lordosis.  No evidence of fracture or dislocation.  Vertebral body heights and vertebral disc spaces are maintained.  The dens appears intact.  No prevertebral soft tissue swelling.  Moderate multilevel degenerative changes.  Visualized thyroid is mildly nodular.  Visualized lung apices are essentially clear.  IMPRESSION: No evidence of traumatic injury to the cervical spine.  Moderate multilevel degenerative changes.   Original Report Authenticated By: Charline Bills, M.D.    Ct Cervical Spine Wo Contrast  11/07/2011  *RADIOLOGY REPORT*  Clinical Data:  Unwitnessed fall, dementia  CT HEAD WITHOUT CONTRAST CT CERVICAL SPINE WITHOUT CONTRAST  Technique:  Multidetector CT imaging of the head and cervical spine was performed following the standard protocol without intravenous contrast.  Multiplanar CT image reconstructions of the cervical spine were also generated.  Comparison:  None.  CT HEAD  Findings: No evidence of parenchymal hemorrhage or extra-axial fluid collection. No mass lesion, mass effect, or midline shift.  No CT evidence of acute infarction.  Extensive small vessel ischemic changes.  Severe cortical and central atrophy, advanced for age.  Secondary ventriculomegaly.  The visualized paranasal sinuses are essentially clear. The mastoid air cells are unopacified.  No evidence of calvarial fracture.  IMPRESSION: No evidence of acute intracranial abnormality.  Severe atrophy with secondary ventriculomegaly.  Extensive small vessel ischemic changes.  CT CERVICAL SPINE  Findings: Normal cervical lordosis.  No evidence of fracture or dislocation.  Vertebral body heights and vertebral disc spaces are maintained.  The dens appears intact.  No prevertebral soft tissue swelling.  Moderate multilevel degenerative changes.  Visualized thyroid is mildly nodular.  Visualized lung apices are essentially clear.  IMPRESSION: No evidence of traumatic injury to the cervical  spine.  Moderate multilevel degenerative changes.   Original Report Authenticated By: Charline Bills, M.D.      1. Fall       MDM  CT head and neck, and obtain pelvic x-ray to rule out any fractures or pathology         Arman Filter, NP 11/07/11 1959  Arman Filter, NP 11/07/11 2000  Arman Filter, NP 11/07/11 2000

## 2011-11-07 NOTE — ED Notes (Signed)
[facility] contacted for transport 

## 2011-11-07 NOTE — ED Notes (Signed)
ZOX:[id number]<AV> Expected date:11/07/11<BR> Expected time: 6:23 PM<BR> Means of arrival:Ambulance<BR> Comments:<BR> 70yoM, fall

## 2011-11-07 NOTE — ED Notes (Signed)
Pt present to ED on c-collar and LSB, from [facility], Pt fell out of bed, un witnessed, possible hit head. Pt has dementia, poor historian. No hematoma, no skin injury. DNR. Upon communicating with patient, pt is non-verbal, has dementia, CBG 135, has hand tremors which is a baseline for her.

## 2011-11-09 NOTE — ED Provider Notes (Signed)
Medical screening examination/treatment/procedure(s) were performed by non-physician practitioner and as supervising physician I was immediately available for consultation/collaboration.   Ocean Schildt, MD 11/09/11 2342 

## 2011-12-30 ENCOUNTER — Encounter (HOSPITAL_COMMUNITY): Payer: Self-pay | Admitting: Emergency Medicine

## 2011-12-30 ENCOUNTER — Inpatient Hospital Stay (HOSPITAL_COMMUNITY)
Admission: EM | Admit: 2011-12-30 | Discharge: 2012-01-02 | DRG: 683 | Disposition: A | Discharge status: Nursing Home (Includes ICF) | Payer: Medicare Other | Attending: Internal Medicine | Admitting: Internal Medicine

## 2011-12-30 DIAGNOSIS — Z79899 Other long term (current) drug therapy: Secondary | ICD-10-CM

## 2011-12-30 DIAGNOSIS — E870 Hyperosmolality and hypernatremia: Secondary | ICD-10-CM | POA: Diagnosis present

## 2011-12-30 DIAGNOSIS — Z7982 Long term (current) use of aspirin: Secondary | ICD-10-CM

## 2011-12-30 DIAGNOSIS — F411 Generalized anxiety disorder: Secondary | ICD-10-CM | POA: Diagnosis present

## 2011-12-30 DIAGNOSIS — N390 Urinary tract infection, site not specified: Secondary | ICD-10-CM | POA: Diagnosis present

## 2011-12-30 DIAGNOSIS — I10 Essential (primary) hypertension: Secondary | ICD-10-CM | POA: Diagnosis present

## 2011-12-30 DIAGNOSIS — F03918 Unspecified dementia, unspecified severity, with other behavioral disturbance: Secondary | ICD-10-CM | POA: Diagnosis present

## 2011-12-30 DIAGNOSIS — N179 Acute kidney failure, unspecified: Secondary | ICD-10-CM

## 2011-12-30 DIAGNOSIS — F0391 Unspecified dementia with behavioral disturbance: Secondary | ICD-10-CM | POA: Diagnosis present

## 2011-12-30 DIAGNOSIS — E860 Dehydration: Secondary | ICD-10-CM | POA: Diagnosis present

## 2011-12-30 DIAGNOSIS — K219 Gastro-esophageal reflux disease without esophagitis: Secondary | ICD-10-CM | POA: Diagnosis present

## 2011-12-30 DIAGNOSIS — Z23 Encounter for immunization: Secondary | ICD-10-CM

## 2011-12-30 DIAGNOSIS — A498 Other bacterial infections of unspecified site: Secondary | ICD-10-CM | POA: Diagnosis present

## 2011-12-30 DIAGNOSIS — Z66 Do not resuscitate: Secondary | ICD-10-CM | POA: Diagnosis present

## 2011-12-30 DIAGNOSIS — R64 Cachexia: Secondary | ICD-10-CM | POA: Diagnosis present

## 2011-12-30 DIAGNOSIS — R410 Disorientation, unspecified: Secondary | ICD-10-CM

## 2011-12-30 DIAGNOSIS — E876 Hypokalemia: Secondary | ICD-10-CM | POA: Diagnosis present

## 2011-12-30 DIAGNOSIS — IMO0002 Reserved for concepts with insufficient information to code with codable children: Secondary | ICD-10-CM

## 2011-12-30 LAB — BASIC METABOLIC PANEL
CO2: 22 mEq/L (ref 19–32)
Calcium: 10.9 mg/dL — ABNORMAL HIGH (ref 8.4–10.5)
Chloride: 129 mEq/L — ABNORMAL HIGH (ref 96–112)
Creatinine, Ser: 1.24 mg/dL — ABNORMAL HIGH (ref 0.50–1.10)
GFR calc Af Amer: 49 mL/min — ABNORMAL LOW (ref >90)
Sodium: 169 mEq/L (ref 135–145)

## 2011-12-30 LAB — CBC WITH DIFFERENTIAL/PLATELET
Basophils Absolute: 0 10*3/uL (ref 0.0–0.1)
Eosinophils Relative: 0 % (ref 0–5)
Lymphocytes Relative: 19 % (ref 12–46)
Monocytes Relative: 7 % (ref 3–12)
Platelets: 169 10*3/uL (ref 150–400)
RBC: 4.62 MIL/uL (ref 3.87–5.11)
RDW: 14.2 % (ref 11.5–15.5)
Smear Review: ADEQUATE
WBC: 15.3 10*3/uL — ABNORMAL HIGH (ref 4.0–10.5)

## 2011-12-30 LAB — URINE MICROSCOPIC-ADD ON

## 2011-12-30 LAB — URINALYSIS, ROUTINE W REFLEX MICROSCOPIC
Glucose, UA: NEGATIVE mg/dL
Ketones, ur: NEGATIVE mg/dL
Leukocytes, UA: NEGATIVE
Nitrite: POSITIVE — AB
Protein, ur: 30 mg/dL — AB
pH: 5.5 (ref 5.0–8.0)

## 2011-12-30 MED ORDER — ASPIRIN 81 MG PO CHEW
81.0000 mg | CHEWABLE_TABLET | Freq: Every day | ORAL | Status: DC
  Administered 2011-12-31 – 2012-01-02 (×3): 81 mg via ORAL
  Filled 2011-12-30 (×3): qty 1

## 2011-12-30 MED ORDER — ACETAMINOPHEN 500 MG PO TABS
500.0000 mg | ORAL_TABLET | Freq: Four times a day (QID) | ORAL | Status: DC
  Administered 2011-12-31 – 2012-01-02 (×10): 500 mg via ORAL
  Filled 2011-12-30 (×10): qty 1

## 2011-12-30 MED ORDER — DEXTROSE 5 % IV SOLN
1.0000 g | INTRAVENOUS | Status: DC
  Administered 2011-12-31: 1 g via INTRAVENOUS
  Filled 2011-12-30 (×2): qty 10

## 2011-12-30 MED ORDER — RIVASTIGMINE TARTRATE 3 MG PO CAPS
3.0000 mg | ORAL_CAPSULE | Freq: Two times a day (BID) | ORAL | Status: DC
  Administered 2011-12-31 – 2012-01-02 (×5): 3 mg via ORAL
  Filled 2011-12-30 (×12): qty 1

## 2011-12-30 MED ORDER — DEXTROSE 5 % IV SOLN
INTRAVENOUS | Status: DC
  Administered 2011-12-31 – 2012-01-02 (×8): via INTRAVENOUS

## 2011-12-30 MED ORDER — HALOPERIDOL LACTATE 5 MG/ML IJ SOLN
1.0000 mg | Freq: Four times a day (QID) | INTRAMUSCULAR | Status: DC | PRN
  Filled 2011-12-30: qty 1

## 2011-12-30 MED ORDER — DEXTROSE 5 % IV SOLN
1.0000 g | Freq: Once | INTRAVENOUS | Status: AC
  Administered 2011-12-30: 1 g via INTRAVENOUS
  Filled 2011-12-30: qty 10

## 2011-12-30 MED ORDER — BIOTENE DRY MOUTH MT LIQD
15.0000 mL | Freq: Two times a day (BID) | OROMUCOSAL | Status: DC
  Administered 2011-12-30 – 2012-01-02 (×6): 15 mL via OROMUCOSAL

## 2011-12-30 MED ORDER — SODIUM CHLORIDE 0.9 % IV SOLN
Freq: Once | INTRAVENOUS | Status: AC
  Administered 2011-12-30: 16:00:00 via INTRAVENOUS

## 2011-12-30 MED ORDER — SODIUM CHLORIDE 0.9 % IV BOLUS (SEPSIS)
500.0000 mL | Freq: Once | INTRAVENOUS | Status: AC
  Administered 2011-12-30: 500 mL via INTRAVENOUS

## 2011-12-30 MED ORDER — MEGESTROL ACETATE 625 MG/5ML PO SUSP: 625.0000 mg | Freq: Every day | ORAL | Status: DC

## 2011-12-30 MED ORDER — BUSPIRONE HCL 5 MG PO TABS
5.0000 mg | ORAL_TABLET | Freq: Three times a day (TID) | ORAL | Status: DC
  Administered 2011-12-30 – 2012-01-02 (×8): 5 mg via ORAL
  Filled 2011-12-30 (×9): qty 1

## 2011-12-30 MED ORDER — TRAZODONE HCL 50 MG PO TABS
150.0000 mg | ORAL_TABLET | Freq: Every day | ORAL | Status: DC
  Administered 2011-12-30 – 2012-01-01 (×3): 150 mg via ORAL
  Filled 2011-12-30 (×3): qty 3

## 2011-12-30 MED ORDER — PNEUMOCOCCAL VAC POLYVALENT 25 MCG/0.5ML IJ INJ
0.5000 mL | INJECTION | INTRAMUSCULAR | Status: AC
  Administered 2011-12-31: 0.5 mL via INTRAMUSCULAR
  Filled 2011-12-30: qty 0.5

## 2011-12-30 MED ORDER — ENOXAPARIN SODIUM 30 MG/0.3ML ~~LOC~~ SOLN
30.0000 mg | SUBCUTANEOUS | Status: DC
  Administered 2011-12-30: 30 mg via SUBCUTANEOUS
  Filled 2011-12-30: qty 0.3

## 2011-12-30 MED ORDER — ONDANSETRON HCL 4 MG PO TABS: 4.0000 mg | ORAL_TABLET | Freq: Four times a day (QID) | ORAL | Status: DC | PRN

## 2011-12-30 MED ORDER — ONDANSETRON HCL 4 MG/2ML IJ SOLN: 4.0000 mg | Freq: Four times a day (QID) | INTRAMUSCULAR | Status: DC | PRN

## 2011-12-30 NOTE — ED Provider Notes (Signed)
History    This chart was scribed for Joya Gaskins, MD, MD by Smitty Pluck, ED Scribe. The patient was seen in room APA10 and the patient's care was started at 2:23PM.   CSN: [id number]  Arrival date & time 12/30/11  1401        Chief Complaint  Patient presents with  . Altered Mental Status     Patient is a 71 y.o. female presenting with altered mental status. The history is provided by the EMS personnel. The history is limited by the absence of a caregiver and the condition of the patient. No language interpreter was used.  Altered Mental Status The current episode started 3 to 5 hours ago. The problem occurs constantly. The problem has not changed since onset.  Level 5 caveat for dementia Jillian King is a 71 y.o. female with hx of HTN and dementia who presents to the Emergency Department BIB EMS from nursing home due to having altered mental status this morning. EMS reports that pt was at baseline upon arrival to nursing home. Nursing home staff reports that pt had foul smelling urine and that the urine was dark in color.    Past Medical History  Diagnosis Date  . Dementia   . GERD (gastroesophageal reflux disease)   . Hypokalemia   . Hip fracture   . Anxiety   . Depression   . Hypertension     History reviewed. No pertinent past surgical history.  History reviewed. No pertinent family history.  History  Substance Use Topics  . Smoking status: Never Smoker   . Smokeless tobacco: Not on file  . Alcohol Use: No    OB History    Grav Para Term Preterm Abortions TAB SAB Ect Mult Living                  Review of Systems  Unable to perform ROS: Dementia  Psychiatric/Behavioral: Positive for altered mental status.    Allergies  Review of patient's allergies indicates no known allergies.  Home Medications     BP 127/96  Pulse 54  Temp 100 F (37.8 C) (Rectal)  Resp 15  Ht 5\' 3"  (1.6 m)  Wt 125 lb (56.7 kg)  BMI 22.14 kg/m2  SpO2  100%  Physical Exam  Nursing note and vitals reviewed.  CONSTITUTIONAL: Well developed/well nourished HEAD AND FACE: Normocephalic/atraumatic EYES: EOMI/PERRL ENMT: Mucous membranes dry NECK: supple no meningeal signs SPINE:entire spine nontender CV: S1/S2 noted, no murmurs/rubs/gallops noted LUNGS: Lungs are clear to auscultation bilaterally, no apparent distress ABDOMEN: soft, nontender, no rebound or guarding GU:no cva tenderness NEURO: Pt is awake/alert, moves all extremitiesx4, does not follow command, combative She will speak incoherently EXTREMITIES: pulses normal, full ROM. No deformity or signs of trauma SKIN: warm, color normal PSYCH: anxious  ED Course  Procedures DIAGNOSTIC STUDIES: Oxygen Saturation is 100% on room air, normal by my interpretation.    COORDINATION OF CARE: 2:24 PM Discussed ED treatment with pt  4:37 PM Pt with hypernatremia/dehydration and UTI Will admit She is a DNR D/w dr Lendell Caprice, to admit Pt stable in the ED   Labs Reviewed  URINALYSIS, ROUTINE W REFLEX MICROSCOPIC - Abnormal; Notable for the following:    Specific Gravity, Urine >1.030 (*)     Hgb urine dipstick MODERATE (*)     Protein, ur 30 (*)     Nitrite POSITIVE (*)     All other components within normal limits  URINE MICROSCOPIC-ADD ON - Abnormal;  Notable for the following:    Bacteria, UA MANY (*)     All other components within normal limits  BASIC METABOLIC PANEL  CBC WITH DIFFERENTIAL  URINE CULTURE  URINE CULTURE      MDM  Nursing notes including past medical history and social history reviewed and considered in documentation Labs/vital reviewed and considered Previous records reviewed and considered - h/o dementia noted    I personally performed the services described in this documentation, which was scribed in my presence. The recorded information has been reviewed and is accurate.      Joya Gaskins, MD 12/30/11 [id number]

## 2011-12-30 NOTE — ED Notes (Signed)
Attempted to call report. Abby RN to call back.

## 2011-12-30 NOTE — H&P (Signed)
Hospital Admission Note Date: 12/30/2011  Patient name: Jillian King Medical record number: [id number] Date of birth: [birth year] Age: 71 y.o. Gender: female PCP: Galvin Proffer, MD  Attending physician: Christiane Ha, MD  Chief Complaint: brief episode of altered mental status  History of Present Illness:  Jillian King is an 71 y.o. female from a group home with reports of transient decreased level of consciousness. She has very advanced dementia and is unable to provide any history. No family members are available. In the emergency room, she was found to have a sodium of 169. Urinary tract infection and azotemia. Her CODE STATUS is DO NOT RESUSCITATE based on paperwork that comes from the group home. Currently she is quite agitated and yelling out in bed.  Past Medical History  Diagnosis Date  . Dementia   . GERD (gastroesophageal reflux disease)   . Hypokalemia   . Hip fracture   . Anxiety   . Depression   . Hypertension     Meds: Prescriptions prior to admission  Medication Sig Dispense Refill  . acetaminophen (TYLENOL) 650 MG CR tablet Take 650 mg by mouth 3 (three) times daily.      Marland Kitchen aspirin 81 MG chewable tablet Chew 81 mg by mouth daily.        . busPIRone (BUSPAR) 5 MG tablet Take 5 mg by mouth 3 (three) times daily.      . Calcium Carb-Cholecalciferol (OS-CAL) 500-600 MG-UNIT CHEW Chew 1 each by mouth 3 (three) times daily.        . megestrol (MEGACE ES) 625 MG/5ML suspension Take 625 mg by mouth daily.      . Multiple Vitamin (MULTIVITAMIN) LIQD Take 5 mLs by mouth daily.      . rivastigmine (EXELON) 3 MG capsule Take 3 mg by mouth 2 (two) times daily.        . traZODone (DESYREL) 150 MG tablet Take 150 mg by mouth at bedtime.        Marland Kitchen UNABLE TO FIND Take 120 mLs by mouth 4 (four) times daily. Med Name: MIGHTY SHAKE      . aluminum & magnesium hydroxide (MAALOX) 225-200 MG/5ML suspension Take 30 mLs by mouth 4 (four) times daily as needed. For  heartburn/indigestion. Max 4/24hrs.      Marland Kitchen anti-nausea (EMETROL) solution Take 30 mLs by mouth every 30 (thirty) minutes as needed. For nausea. X 4 doses if no relief notify MD within 24 hrs.      . diphenhydrAMINE (BENADRYL) 25 mg capsule Take 25 mg by mouth every 4 (four) hours as needed. For minor allergic reaction.      Marland Kitchen guaifenesin (ROBITUSSIN) 100 MG/5ML syrup Take 200 mg by mouth every 6 (six) hours as needed. FOR COUGH. MAX 4 DOSES PER 24 HRS.      Marland Kitchen loperamide (IMODIUM) 2 MG capsule Take 2 mg by mouth 4 (four) times daily as needed. 2 capsule with 1st loose stool, 1 capsule after each consecutive stool up to 4 doses/24 hrs.      . magnesium hydroxide (MILK OF MAGNESIA) 400 MG/5ML suspension Take 30 mLs by mouth 2 (two) times daily as needed. FOR CONSTIPATION      . sodium phosphate Pediatric (FLEET) 3.5-9.5 GM/59ML enema Place 1 enema rectally as needed. For constipation not relieved by milk of magnesia.        Allergies: Review of patient's allergies indicates no known allergies.  Social history: CODE STATUS is DO NOT RESUSCITATE otherwise unable  to obtain due to patient factors.  Family history unknown. Patient unable to provide history.  Past surgical history unknown, patient unable to provide history.  Review of Systems: Unable to obtain due to patient factors to  Physical Exam: Blood pressure 105/58, pulse 93, temperature 99.3 F (37.4 C), temperature source Axillary, resp. rate 20, height 5\' 3"  (1.6 m), weight 56.7 kg (125 lb), SpO2 100.00%. BP 109/54  Pulse 61  Temp 98.5 F (36.9 C) (Axillary)  Resp 18  Ht 5\' 3"  (1.6 m)  Wt 56.7 kg (125 lb)  BMI 22.14 kg/m2  SpO2 91%  General Appearance:    Alert, agitated and uncooperative. Yelling out periodically.   Head:    Normocephalic, without obvious abnormality, atraumatic  Eyes:    PERRL, conjunctiva/corneas clear, EOM's intact, fundi    benign, both eyes  Ears:    Normal TM's and external ear canals, both ears  Nose:    Nares normal, septum midline, mucosa normal, no drainage    or sinus tenderness  Throat:   extremely dry mucous membranes. Edentulous.   Neck:   Supple, symmetrical, trachea midline, no adenopathy;    thyroid:  no enlargement/tenderness/nodules; no carotid   bruit or JVD  Back:     Symmetric, no curvature, ROM normal, no CVA tenderness  Lungs:     Clear to auscultation bilaterally, respirations unlabored  Chest Wall:    No tenderness or deformity   Heart:    Regular rate and rhythm, S1 and S2 normal, no murmur, rub   or gallop     Abdomen:     Soft, non-tender, bowel sounds active all four quadrants,    no masses, no organomegaly  Genitalia:    Foley catheter draining cloudy urine   Rectal:    external exam unremarkable. Digital rectal exam not performed.   Extremities:   Extremities normal, atraumatic, no cyanosis or edema  Pulses:   2+ and symmetric all extremities  Skin:   Skin color, texture, turgor normal, no rashes or lesions  Lymph nodes:   Cervical, supraclavicular, and axillary nodes normal  Neurologic:   CNII-XII intact, normal strength, sensation and reflexes    throughout    Psychiatric: Uncooperative and agitated  Lab results: Basic Metabolic Panel:  Basename 12/30/11 1515  NA 169*  K 4.1  CL 129*  CO2 22  GLUCOSE 115*  BUN 52*  CREATININE 1.24*  CALCIUM 10.9*  MG --  PHOS --   Liver Function Tests: No results found for this basename: AST:2,ALT:2,ALKPHOS:2,BILITOT:2,PROT:2,ALBUMIN:2 in the last 72 hours No results found for this basename: LIPASE:2,AMYLASE:2 in the last 72 hours No results found for this basename: AMMONIA:2 in the last 72 hours CBC:  Basename 12/30/11 1515  WBC 15.3*  NEUTROABS 11.3*  HGB 15.1*  HCT 47.9*  MCV 103.7*  PLT 169   Cardiac Enzymes: No results found for this basename: CKTOTAL:3,CKMB:3,CKMBINDEX:3,TROPONINI:3 in the last 72 hours BNP: No results found for this basename: PROBNP:3 in the last 72 hours D-Dimer: No results  found for this basename: DDIMER:2 in the last 72 hours CBG: No results found for this basename: GLUCAP:6 in the last 72 hours Hemoglobin A1C: No results found for this basename: HGBA1C in the last 72 hours Fasting Lipid Panel: No results found for this basename: CHOL,HDL,LDLCALC,TRIG,CHOLHDL,LDLDIRECT in the last 72 hours Thyroid Function Tests: No results found for this basename: TSH,T4TOTAL,FREET4,T3FREE,THYROIDAB in the last 72 hours Anemia Panel: No results found for this basename: VITAMINB12,FOLATE,FERRITIN,TIBC,IRON,RETICCTPCT in the last 72 hours Coagulation:  No results found for this basename: LABPROT:2,INR:2 in the last 72 hours Urine Drug Screen: Drugs of Abuse  No results found for this basename: labopia, cocainscrnur, labbenz, amphetmu, thcu, labbarb    Alcohol Level: No results found for this basename: ETH:2 in the last 72 hours Urinalysis:  Basename 12/30/11 1400  COLORURINE YELLOW  LABSPEC >1.030*  PHURINE 5.5  GLUCOSEU NEGATIVE  HGBUR MODERATE*  BILIRUBINUR NEGATIVE  KETONESUR NEGATIVE  PROTEINUR 30*  UROBILINOGEN 0.2  NITRITE POSITIVE*  LEUKOCYTESUR NEGATIVE    Imaging results:  No results found.  Assessment & Plan: Active Problems:  Hypernatremia  Dehydration  UTI (urinary tract infection)  Dementia with behavioral disturbance  GERD (gastroesophageal reflux disease)  Patient will get hypotonic IV fluids. Continue ceftriaxone for urinary tract infection. Will get Haldol as needed for severe agitation. Mechanical soft diet, as she is edentulous. DVT prophylaxis. Mouth care every shift. Continue DO NOT RESUSCITATE status. Her paperwork  Tasneem Cormier L 12/30/2011, 7:32 PM

## 2011-12-30 NOTE — ED Notes (Signed)
Lab called with critical value Na 169

## 2011-12-30 NOTE — ED Notes (Signed)
Dr Wickline at bedside,  

## 2011-12-30 NOTE — ED Notes (Signed)
ems reported pt had AMS at nursing home this am. Pt returned to baseline after paramedics arrived on scene. NH staff reported pt having dark, foul smelling urine.

## 2011-12-31 DIAGNOSIS — F0391 Unspecified dementia with behavioral disturbance: Secondary | ICD-10-CM

## 2011-12-31 LAB — CBC
HCT: 41.4 % (ref 36.0–46.0)
Hemoglobin: 12.8 g/dL (ref 12.0–15.0)
MCH: 31.8 pg (ref 26.0–34.0)
MCHC: 30.9 g/dL (ref 30.0–36.0)

## 2011-12-31 LAB — FOLATE RBC: RBC Folate: 552 ng/mL (ref >=366)

## 2011-12-31 LAB — BASIC METABOLIC PANEL
BUN: 44 mg/dL — ABNORMAL HIGH (ref 6–23)
Chloride: >130 mEq/L (ref 96–112)
GFR calc Af Amer: 61 mL/min — ABNORMAL LOW (ref >90)
Glucose, Bld: 118 mg/dL — ABNORMAL HIGH (ref 70–99)
Potassium: 3.4 mEq/L — ABNORMAL LOW (ref 3.5–5.1)

## 2011-12-31 MED ORDER — MUPIROCIN 2 % EX OINT
1.0000 "application " | TOPICAL_OINTMENT | Freq: Two times a day (BID) | CUTANEOUS | Status: DC
  Administered 2011-12-31 – 2012-01-02 (×4): 1 via NASAL
  Filled 2011-12-31: qty 22

## 2011-12-31 MED ORDER — PRO-STAT SUGAR FREE PO LIQD
30.0000 mL | Freq: Three times a day (TID) | ORAL | Status: DC
  Administered 2012-01-01 – 2012-01-02 (×2): 30 mL via ORAL
  Filled 2011-12-31 (×2): qty 30

## 2011-12-31 MED ORDER — ENOXAPARIN SODIUM 40 MG/0.4ML ~~LOC~~ SOLN
40.0000 mg | SUBCUTANEOUS | Status: DC
  Administered 2011-12-31 – 2012-01-01 (×2): 40 mg via SUBCUTANEOUS
  Filled 2011-12-31 (×2): qty 0.4

## 2011-12-31 MED ORDER — CHLORHEXIDINE GLUCONATE CLOTH 2 % EX PADS
6.0000 | MEDICATED_PAD | Freq: Every day | CUTANEOUS | Status: DC
  Administered 2012-01-01 (×2): 6 via TOPICAL

## 2011-12-31 MED ORDER — MEGESTROL ACETATE 400 MG/10ML PO SUSP
800.0000 mg | Freq: Every day | ORAL | Status: DC
  Administered 2011-12-31 – 2012-01-02 (×3): 800 mg via ORAL
  Filled 2011-12-31: qty 20
  Filled 2011-12-31: qty 10
  Filled 2011-12-31: qty 20

## 2011-12-31 NOTE — Progress Notes (Signed)
INITIAL NUTRITION ASSESSMENT  DOCUMENTATION CODES Per approved criteria  -Not Applicable    INTERVENTION: -Recommend ST eval for appropriate diet and liquid consistency -Add Magic Cup with lunch and dinner daily each supplement provides 290 kcal and 9 grams of protein. -Add ProStat 30 ml TID (300 kcal, 45 gr protein daily)  NUTRITION DIAGNOSIS: Inadequate oral intake related to poor appetite as evidenced by diet hx andunplanned wt loss  Goal: Pt to meet >/= 90% of their estimated nutrition needs; not met  Monitor:  Monitor labs, meal intake and wt trends, skin assessments  Reason for Assessment: Malnutrition Screen and  Low Braden score  Admitting Dx: Altered Mental status  ASSESSMENT:  Pt unable to provide hx. 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 Pt wt at that time 179# (81 kg). Estimated wt loss due to amputation ~26#. Pt has continued to lose addition wt 28#, 18% over past year which is trending toward significant. She also has skin breakdown to Sacrum Stage II. Poor appetite reported and Megace noted on medication list. Pt teeth in very poor condition. She was receiving a Pureed diet with thickened liquids at [facility] per family. Fed by staff. She is at risk for malnutrition given her hx of poor appetite and unplanned wt loss.  IVF-D5 @150  ml/hr provides ~600 kcal/day dextrose  Height: Ht Readings from Last 1 Encounters:  12/30/11 5\' 3"  (1.6 m)   Weight: Wt Readings from Last 1 Encounters:  12/30/11 125 lb (56.7 kg)   Ideal Body Weight: 95# (43kg)  % Ideal Body Weight: 131%  Wt Readings from Last 10 Encounters:  12/30/11 125 lb (56.7 kg)   Usual Body Weight: 179# last November per family member  % Usual Body Weight: 70% s/p AKA amputation and poor appetite PTA  BMI:  Body mass index is 22.14 kg/(m^2). Normal range  Estimated Nutritional Needs: Kcal: 1417-1701  Protein: 80-97 gr Fluid: 1 ml/kcal  Skin: Stage II to  sacrum  Diet Order: Dysphagia 3 thin liquids  EDUCATION NEEDS: -Education not appropriate at this time   Intake/Output Summary (Last 24 hours) at 12/31/11 1147 Last data filed at 12/31/11 0415  Gross per 24 hour  Intake    575 ml  Output      0 ml  Net    575 ml   Last BM: PTA  Labs:   Lab 12/31/11 0520 12/30/11 1515  NA 165* 169*  K 3.4* 4.1  CL >130* 129*  CO2 23 22  BUN 44* 52*  CREATININE 1.04 1.24*  CALCIUM 9.5 10.9*  MG -- --  PHOS -- --  GLUCOSE 118* 115*    CBG (last 3)  No results found for this basename: GLUCAP:3 in the last 72 hours  Scheduled Meds:   . [COMPLETED] sodium chloride   Intravenous Once  . acetaminophen  500 mg Oral Q6H  . antiseptic oral rinse  15 mL Mouth Rinse BID  . aspirin  81 mg Oral Daily  . busPIRone  5 mg Oral TID  . [COMPLETED] cefTRIAXone (ROCEPHIN)  IV  1 g Intravenous Once  . cefTRIAXone (ROCEPHIN)  IV  1 g Intravenous Q24H  . enoxaparin (LOVENOX) injection  30 mg Subcutaneous Q24H  . megestrol  800 mg Oral Daily  . [COMPLETED] pneumococcal 23 valent vaccine  0.5 mL Intramuscular Tomorrow-1000  . rivastigmine  3 mg Oral BID  . [COMPLETED] sodium chloride  500 mL Intravenous Once  . traZODone  150 mg Oral  QHS  . [DISCONTINUED] megestrol  625 mg Oral Daily   Continuous Infusions:   . dextrose 150 mL/hr at 12/31/11 1191    Past Medical History  Diagnosis Date  . Dementia   . GERD (gastroesophageal reflux disease)   . Hypokalemia   . Hip fracture   . Anxiety   . Depression   . Hypertension     History reviewed. No pertinent past surgical history.    #[phone]

## 2011-12-31 NOTE — Progress Notes (Signed)
Lab called and gave RN critical lab values at 0700. Na-165 and Cl- 132. Text paged doctor at [phone]. Day shift nurse was made aware to contact doctor again with lab values.

## 2011-12-31 NOTE — Clinical Social Work Psychosocial (Signed)
Clinical Social Work Department BRIEF PSYCHOSOCIAL ASSESSMENT 12/31/2011  Patient:  PRERANA, STRAYER     Account Number:  [id number]     Admit date:  12/30/2011  Clinical Social Worker:  Nancie Neas  Date/Time:  12/31/2011 11:00 AM  Referred by:  Physician  Date Referred:  12/31/2011 Referred for  ALF Placement   Other Referral:   Interview type:  Patient Other interview type:    PSYCHOSOCIAL DATA Living Status:  FACILITY Admitted from facility:   Level of care:  Assisted Living Primary support name:  John Primary support relationship to patient:  CHILD, ADULT Degree of support available:   limited    CURRENT CONCERNS Current Concerns  Post-Acute Placement   Other Concerns:    SOCIAL WORK ASSESSMENT / PLAN CSW spoke with Inetta Fermo at [facility] as pt is not oriented. Pt has been resident at facility for several years and is on the memory care unit. Inetta Fermo reports she is a total care for the most part but can feed herself sometimes when set up. Inetta Fermo feels pt may need to be assessed prior to return but should be okay to return. Facility uses [facility] if home health is needed at d/c. Pt's son is listed as contact but does not visit often per Inetta Fermo. CSW spoke with [name] who admits he does not get to visit as often as he should. He would like for pt to return to [facility] at d/c.   Assessment/plan status:  Psychosocial Support/Ongoing Assessment of Needs Other assessment/ plan:   Information/referral to community resources:   [facility] [place]    PATIENT'S/FAMILY'S RESPONSE TO PLAN OF CARE: Pt unable to participate in plan of care. Pt's son reports positive feelings regarding return to [facility] when stable. CSW to continue to follow. CSW requested facility to plan on assessing pt tomorrow in hospital.        Derenda Fennel, LCSW [phone]

## 2011-12-31 NOTE — Progress Notes (Signed)
Subjective: Unable.  No new problems per RN  Objective: Vital signs in last 24 hours: Filed Vitals:   12/30/11 2300 12/31/11 0500 12/31/11 1400 12/31/11 2254  BP: 93/54 100/56 134/67 100/57  Pulse: 89 86 89 69  Temp: 99 F (37.2 C) 99 F (37.2 C) 97.6 F (36.4 C) 99.5 F (37.5 C)  TempSrc: Axillary Axillary Oral Oral  Resp: 18 18 18 18   Height:      Weight:      SpO2: 100% 98% 95% 99%   Weight change:   Intake/Output Summary (Last 24 hours) at 12/31/11 2326 Last data filed at 12/31/11 1900  Gross per 24 hour  Intake   2785 ml  Output      0 ml  Net   2785 ml   Gen:  Agitated.  Confused. Lungs CTA without WRR CV RRR without MGR Abd S, NT, ND Ext S, NT, ND  Lab Results: Basic Metabolic Panel:  Lab 12/31/11 1610 12/30/11 1515  NA 165* 169*  K 3.4* 4.1  CL >130* 129*  CO2 23 22  GLUCOSE 118* 115*  BUN 44* 52*  CREATININE 1.04 1.24*  CALCIUM 9.5 10.9*  MG -- --  PHOS -- --   Liver Function Tests: No results found for this basename: AST:2,ALT:2,ALKPHOS:2,BILITOT:2,PROT:2,ALBUMIN:2 in the last 168 hours No results found for this basename: LIPASE:2,AMYLASE:2 in the last 168 hours No results found for this basename: AMMONIA:2 in the last 168 hours CBC:  Lab 12/31/11 0520 12/30/11 1515  WBC 12.4* 15.3*  NEUTROABS -- 11.3*  HGB 12.8 15.1*  HCT 41.4 47.9*  MCV 102.7* 103.7*  PLT 135* 169   Cardiac Enzymes: No results found for this basename: CKTOTAL:3,CKMB:3,CKMBINDEX:3,TROPONINI:3 in the last 168 hours BNP: No results found for this basename: PROBNP:3 in the last 168 hours D-Dimer: No results found for this basename: DDIMER:2 in the last 168 hours CBG: No results found for this basename: GLUCAP:6 in the last 168 hours Hemoglobin A1C: No results found for this basename: HGBA1C in the last 168 hours Fasting Lipid Panel: No results found for this basename: CHOL,HDL,LDLCALC,TRIG,CHOLHDL,LDLDIRECT in the last 960 hours Thyroid Function Tests: No results  found for this basename: TSH,T4TOTAL,FREET4,T3FREE,THYROIDAB in the last 168 hours Coagulation: No results found for this basename: LABPROT:4,INR:4 in the last 168 hours Anemia Panel:  Lab 12/30/11 1840  VITAMINB12 557  FOLATE --  FERRITIN --  TIBC --  IRON --  RETICCTPCT --   Urine Drug Screen: Drugs of Abuse  No results found for this basename: labopia, cocainscrnur, labbenz, amphetmu, thcu, labbarb    Alcohol Level: No results found for this basename: ETH:2 in the last 168 hours Urinalysis:  Lab 12/30/11 1400  COLORURINE YELLOW  LABSPEC >1.030*  PHURINE 5.5  GLUCOSEU NEGATIVE  HGBUR MODERATE*  BILIRUBINUR NEGATIVE  KETONESUR NEGATIVE  PROTEINUR 30*  UROBILINOGEN 0.2  NITRITE POSITIVE*  LEUKOCYTESUR NEGATIVE   Micro Results: Recent Results (from the past 240 hour(s))  MRSA PCR SCREENING     Status: Abnormal   Collection Time   12/31/11  1:13 PM      Component Value Range Status Comment   MRSA by PCR POSITIVE (*) NEGATIVE Final    Scheduled Meds:   . acetaminophen  500 mg Oral Q6H  . antiseptic oral rinse  15 mL Mouth Rinse BID  . aspirin  81 mg Oral Daily  . busPIRone  5 mg Oral TID  . cefTRIAXone (ROCEPHIN)  IV  1 g Intravenous Q24H  . Chlorhexidine Gluconate  Cloth  6 each Topical [id number]  . enoxaparin (LOVENOX) injection  40 mg Subcutaneous Q24H  . feeding supplement  30 mL Oral TID WC  . megestrol  800 mg Oral Daily  . mupirocin ointment  1 application Nasal BID  . [COMPLETED] pneumococcal 23 valent vaccine  0.5 mL Intramuscular Tomorrow-1000  . rivastigmine  3 mg Oral BID  . traZODone  150 mg Oral QHS  . [DISCONTINUED] enoxaparin (LOVENOX) injection  30 mg Subcutaneous Q24H  . [DISCONTINUED] megestrol  625 mg Oral Daily   Continuous Infusions:   . dextrose 100 mL/hr at 12/31/11 2127   PRN Meds:.haloperidol lactate, ondansetron (ZOFRAN) IV, ondansetron Assessment/Plan: Active Problems:  Hypernatremia  Dehydration  UTI (urinary tract infection)   Dementia with behavioral disturbance  GERD (gastroesophageal reflux disease)  contine hypotonic fluid, abx. Monitor lytes.   LOS: 1 day   Stasha Naraine L 12/31/2011, 11:26 PM

## 2012-01-01 LAB — URINE CULTURE

## 2012-01-01 LAB — BASIC METABOLIC PANEL
Chloride: 116 mEq/L — ABNORMAL HIGH (ref 96–112)
GFR calc Af Amer: 72 mL/min — ABNORMAL LOW (ref >90)
GFR calc non Af Amer: 62 mL/min — ABNORMAL LOW (ref >90)
Glucose, Bld: 127 mg/dL — ABNORMAL HIGH (ref 70–99)
Potassium: 3.5 mEq/L (ref 3.5–5.1)
Sodium: 146 mEq/L — ABNORMAL HIGH (ref 135–145)

## 2012-01-01 MED ORDER — CEFUROXIME AXETIL 250 MG PO TABS
500.0000 mg | ORAL_TABLET | Freq: Two times a day (BID) | ORAL | Status: DC
  Administered 2012-01-01 – 2012-01-02 (×2): 500 mg via ORAL
  Filled 2012-01-01: qty 1
  Filled 2012-01-01: qty 2

## 2012-01-01 NOTE — Progress Notes (Signed)
Subjective: Unable.  Per RN, tolerating solids, but requires frequent cueing when eating. No other new issues.  Objective: Vital signs in last 24 hours: Filed Vitals:   12/31/11 0500 12/31/11 1400 12/31/11 2254 01/01/12 0526  BP: 100/56 134/67 100/57 109/54  Pulse: 86 89 69 61  Temp: 99 F (37.2 C) 97.6 F (36.4 C) 99.5 F (37.5 C) 98.5 F (36.9 C)  TempSrc: Axillary Oral Oral Axillary  Resp: 18 18 18 18   Height:      Weight:      SpO2: 98% 95% 99% 91%   Weight change:   Intake/Output Summary (Last 24 hours) at 01/01/12 1300 Last data filed at 01/01/12 1200  Gross per 24 hour  Intake 3497.5 ml  Output      0 ml  Net 3497.5 ml   Gen:  Asleep. Arousable. Less agitated today. HEENT: Mucous membranes slightly dry, but improved from admission. Lungs CTA without WRR CV RRR without MGR Abd S, NT, ND Ext S, NT, ND  Lab Results: Basic Metabolic Panel:  Lab 01/01/12 1610 12/31/11 0520  NA 146* 165*  K 3.5 3.4*  CL 116* >130*  CO2 24 23  GLUCOSE 127* 118*  BUN 17 44*  CREATININE 0.91 1.04  CALCIUM 9.0 9.5  MG -- --  PHOS -- --   Liver Function Tests: No results found for this basename: AST:2,ALT:2,ALKPHOS:2,BILITOT:2,PROT:2,ALBUMIN:2 in the last 168 hours No results found for this basename: LIPASE:2,AMYLASE:2 in the last 168 hours No results found for this basename: AMMONIA:2 in the last 168 hours CBC:  Lab 12/31/11 0520 12/30/11 1515  WBC 12.4* 15.3*  NEUTROABS -- 11.3*  HGB 12.8 15.1*  HCT 41.4 47.9*  MCV 102.7* 103.7*  PLT 135* 169   Cardiac Enzymes: No results found for this basename: CKTOTAL:3,CKMB:3,CKMBINDEX:3,TROPONINI:3 in the last 168 hours BNP: No results found for this basename: PROBNP:3 in the last 168 hours D-Dimer: No results found for this basename: DDIMER:2 in the last 168 hours CBG: No results found for this basename: GLUCAP:6 in the last 168 hours Hemoglobin A1C: No results found for this basename: HGBA1C in the last 168 hours Fasting  Lipid Panel: No results found for this basename: CHOL,HDL,LDLCALC,TRIG,CHOLHDL,LDLDIRECT in the last 960 hours Thyroid Function Tests: No results found for this basename: TSH,T4TOTAL,FREET4,T3FREE,THYROIDAB in the last 168 hours Coagulation: No results found for this basename: LABPROT:4,INR:4 in the last 168 hours Anemia Panel:  Lab 12/30/11 1840  VITAMINB12 557  FOLATE --  FERRITIN --  TIBC --  IRON --  RETICCTPCT --   Urine Drug Screen: Drugs of Abuse  No results found for this basename: labopia,  cocainscrnur,  labbenz,  amphetmu,  thcu,  labbarb    Alcohol Level: No results found for this basename: ETH:2 in the last 168 hours Urinalysis:  Lab 12/30/11 1400  COLORURINE YELLOW  LABSPEC >1.030*  PHURINE 5.5  GLUCOSEU NEGATIVE  HGBUR MODERATE*  BILIRUBINUR NEGATIVE  KETONESUR NEGATIVE  PROTEINUR 30*  UROBILINOGEN 0.2  NITRITE POSITIVE*  LEUKOCYTESUR NEGATIVE   Micro Results: Recent Results (from the past 240 hour(s))  URINE CULTURE     Status: Normal (Preliminary result)   Collection Time   12/30/11  2:00 PM      Component Value Range Status Comment   Specimen Description URINE, CLEAN CATCH   Final    Special Requests NONE   Final    Culture  Setup Time 12/30/2011 16:50   Final    Colony Count >=100,000 COLONIES/ML   Final  Culture ESCHERICHIA COLI   Final    Report Status PENDING   Incomplete   MRSA PCR SCREENING     Status: Abnormal   Collection Time   12/31/11  1:13 PM      Component Value Range Status Comment   MRSA by PCR POSITIVE (*) NEGATIVE Final    Scheduled Meds:    . acetaminophen  500 mg Oral Q6H  . antiseptic oral rinse  15 mL Mouth Rinse BID  . aspirin  81 mg Oral Daily  . busPIRone  5 mg Oral TID  . cefTRIAXone (ROCEPHIN)  IV  1 g Intravenous Q24H  . Chlorhexidine Gluconate Cloth  6 each Topical Q0600  . enoxaparin (LOVENOX) injection  40 mg Subcutaneous Q24H  . feeding supplement  30 mL Oral TID WC  . megestrol  800 mg Oral Daily  .  mupirocin ointment  1 application Nasal BID  . rivastigmine  3 mg Oral BID  . traZODone  150 mg Oral QHS  . [DISCONTINUED] enoxaparin (LOVENOX) injection  30 mg Subcutaneous Q24H   Continuous Infusions:    . dextrose 100 mL/hr at 01/01/12 0548   PRN Meds:.haloperidol lactate, ondansetron (ZOFRAN) IV, ondansetron Assessment/Plan: Active Problems:  Hypernatremia improving  Dehydration improved, but clinically still appears somewhat dehydrated.  Escherichia coli UTI (urinary tract infection), sensitivities pending. Will change to by mouth antibiotics and await final culture results.  Dementia with behavioral disturbance  GERD (gastroesophageal reflux disease) Hopefully home in a day or 2 if she continues to improve.    LOS: 2 days   Aniyha Tate L 01/01/2012, 1:00 PM

## 2012-01-01 NOTE — Plan of Care (Signed)
Problem: Phase II Progression Outcomes Goal: Progress activity as tolerated unless otherwise ordered Outcome: Progressing Turn q2hrs, up to chair. Goal: Discharge plan established Outcome: Progressing Plan to d/c back to snf when medically stable

## 2012-01-02 DIAGNOSIS — N179 Acute kidney failure, unspecified: Principal | ICD-10-CM

## 2012-01-02 DIAGNOSIS — R404 Transient alteration of awareness: Secondary | ICD-10-CM

## 2012-01-02 LAB — BASIC METABOLIC PANEL
BUN: 11 mg/dL (ref 6–23)
Chloride: 112 mEq/L (ref 96–112)
GFR calc Af Amer: >90 mL/min (ref >90)
Potassium: 2.7 mEq/L — CL (ref 3.5–5.1)

## 2012-01-02 MED ORDER — CEFUROXIME AXETIL 500 MG PO TABS: 500.0000 mg | ORAL_TABLET | Freq: Two times a day (BID) | ORAL | Status: DC

## 2012-01-02 MED ORDER — POTASSIUM CHLORIDE CRYS ER 20 MEQ PO TBCR
40.0000 meq | EXTENDED_RELEASE_TABLET | Freq: Two times a day (BID) | ORAL | Status: DC
  Administered 2012-01-02: 40 meq via ORAL
  Filled 2012-01-02: qty 2

## 2012-01-02 NOTE — Discharge Summary (Signed)
Physician Discharge Summary  Jillian King NWG:[id number] DOB: 01-05-1941 DOA: 12/30/2011  PCP: Galvin Proffer, MD  Admit date: 12/30/2011 Discharge date: 01/02/2012  Time spent: Less than 30 minutes  Recommendations for Outpatient Follow-up:  1. Follow up with primary care physician.  Discharge Diagnoses:  1. Acute renal failure secondary to dehydration, resolved. 2. Hypernatremia secondary to #1, resolved. 3. Escherichia coli UTI. 4. Severe dementia. 5. GERD.   Discharge Condition: Improved and stable.  Diet recommendation: As tolerated, regular.  Filed Weights   12/30/11 1355  Weight: 56.7 kg (125 lb)    History of present illness:  This 71 year old severely demented lady presents to the hospital with symptoms of altered mental status. Please see initial history as outlined below: Jillian King is an 71 y.o. female from a group home with reports of transient decreased level of consciousness. She has very advanced dementia and is unable to provide any history. No family members are available. In the emergency room, she was found to have a sodium of 169. Urinary tract infection and azotemia. Her CODE STATUS is DO NOT RESUSCITATE based on paperwork that comes from the group home. Currently she is quite agitated and yelling out in bed.  Hospital Course:  The patient was severely dehydrated with significant hypernatremia with a sodium of 169. He was therefore treated quite aggressively with intravenous fluids. She was also treated for her presumed UTI, culture was positive for Escherichia coli. She was transitioned to oral antibiotics. She has done well and appears to be back at her baseline now. Her potassium this morning was 2.7 this is being repleted. Unfortunately, there is no family available to have further conversations regarding any further goals of care. She is a DO NOT RESUSCITATE. She is stable now to be discharged back to the assisted living  facility.  Procedures:  None.   Consultations: None.  Discharge Exam: Filed Vitals:   01/01/12 0526 01/01/12 1353 01/01/12 2247 01/02/12 0618  BP: 109/54 104/62 112/62 113/65  Pulse: 61 61 91 69  Temp: 98.5 F (36.9 C) 98 F (36.7 C) 97.9 F (36.6 C) 97.4 F (36.3 C)  TempSrc: Axillary Oral Oral Oral  Resp: 18 18 20 20   Height:      Weight:      SpO2: 91% 97% 97% 93%    General: She looks systemically well. She is somewhat cachectic. Cardiovascular: Heart sounds are present without murmurs. Respiratory: Lung fields are clear. She is alert but clearly confused, at her baseline likely.  Discharge Instructions  Discharge Orders    Future Orders Please Complete By Expires   Diet - low sodium heart healthy      Increase activity slowly            The results of significant diagnostics from this hospitalization (including imaging, microbiology, ancillary and laboratory) are listed below for reference.    Significant Diagnostic Studies: No results found.  Microbiology: Recent Results (from the past 240 hour(s))  URINE CULTURE     Status: Normal   Collection Time   12/30/11  2:00 PM      Component Value Range Status Comment   Specimen Description URINE, CLEAN CATCH   Final    Special Requests NONE   Final    Culture  Setup Time 12/30/2011 16:50   Final    Colony Count >=100,000 COLONIES/ML   Final    Culture ESCHERICHIA COLI   Final    Report Status 01/01/2012 FINAL   Final  Organism ID, Bacteria ESCHERICHIA COLI   Final   MRSA PCR SCREENING     Status: Abnormal   Collection Time   12/31/11  1:13 PM      Component Value Range Status Comment   MRSA by PCR POSITIVE (*) NEGATIVE Final      Labs: Basic Metabolic Panel:  Lab 01/02/12 7829 01/01/12 0633 12/31/11 0520 12/30/11 1515  NA 143 146* 165* 169*  K 2.7* 3.5 3.4* 4.1  CL 112 116* >130* 129*  CO2 21 24 23 22   GLUCOSE 119* 127* 118* 115*  BUN 11 17 44* 52*  CREATININE 0.75 0.91 1.04 1.24*   CALCIUM 8.8 9.0 9.5 10.9*  MG -- -- -- --  PHOS -- -- -- --       CBC:  Lab 12/31/11 0520 12/30/11 1515  WBC 12.4* 15.3*  NEUTROABS -- 11.3*  HGB 12.8 15.1*  HCT 41.4 47.9*  MCV 102.7* 103.7*  PLT 135* 169        Signed:  Keiyon Plack C  [facility] 01/02/2012, 10:28 AM

## 2012-01-02 NOTE — Clinical Social Work Note (Signed)
Pt d/c today back to [facility]. Pt's son and facility aware and agreeable. Facility understands pt is extensive assist which they report is baseline. She is on memory care unit. Pt to transport via [facility]. FL2 and D/C summary faxed.  Derenda Fennel, [place] [phone]

## 2012-01-02 NOTE — Care Management Note (Signed)
    Page 1 of 1   01/02/2012     11:47:29 AM   CARE MANAGEMENT NOTE 01/02/2012  Patient:  Jillian King, Jillian King   Account Number:  [id number]  Date Initiated:  01/02/2012  Documentation initiated by:  Rosemary Holms  Subjective/Objective Assessment:   Pt admitted from [facility]. DC back to [facility] facility     Action/Plan:   Anticipated DC Date:  01/02/2012   Anticipated DC Plan:  ASSISTED LIVING / REST HOME  In-house referral  Clinical Social Worker      DC Planning Services  CM consult      Choice offered to / List presented to:             Status of service:  Completed, signed off Medicare Important Message given?   (If response is "NO", the following Medicare IM given date fields will be blank) Date Medicare IM given:   Date Additional Medicare IM given:    Discharge Disposition:  ASSISTED LIVING  Per UR Regulation:    If discussed at Long Length of Stay Meetings, dates discussed:    Comments:  01/02/12 Rosemary Holms RN BSN CM

## 2012-01-05 ENCOUNTER — Emergency Department (HOSPITAL_COMMUNITY): Payer: Medicare Other

## 2012-01-05 ENCOUNTER — Emergency Department (HOSPITAL_COMMUNITY)
Admission: EM | Admit: 2012-01-05 | Discharge: 2012-01-05 | Disposition: A | Discharge status: Skilled Nursing Facility | Payer: Medicare Other | Attending: Emergency Medicine | Admitting: Emergency Medicine

## 2012-01-05 ENCOUNTER — Encounter (HOSPITAL_COMMUNITY): Payer: Self-pay | Admitting: *Deleted

## 2012-01-05 DIAGNOSIS — Z8781 Personal history of (healed) traumatic fracture: Secondary | ICD-10-CM | POA: Insufficient documentation

## 2012-01-05 DIAGNOSIS — W19XXXA Unspecified fall, initial encounter: Secondary | ICD-10-CM

## 2012-01-05 DIAGNOSIS — S32010A Wedge compression fracture of first lumbar vertebra, initial encounter for closed fracture: Secondary | ICD-10-CM

## 2012-01-05 DIAGNOSIS — F0390 Unspecified dementia without behavioral disturbance: Secondary | ICD-10-CM | POA: Insufficient documentation

## 2012-01-05 DIAGNOSIS — Z862 Personal history of diseases of the blood and blood-forming organs and certain disorders involving the immune mechanism: Secondary | ICD-10-CM | POA: Insufficient documentation

## 2012-01-05 DIAGNOSIS — Y9210 Unspecified residential institution as the place of occurrence of the external cause: Secondary | ICD-10-CM | POA: Insufficient documentation

## 2012-01-05 DIAGNOSIS — S32009A Unspecified fracture of unspecified lumbar vertebra, initial encounter for closed fracture: Secondary | ICD-10-CM | POA: Insufficient documentation

## 2012-01-05 DIAGNOSIS — Z7982 Long term (current) use of aspirin: Secondary | ICD-10-CM | POA: Insufficient documentation

## 2012-01-05 DIAGNOSIS — F411 Generalized anxiety disorder: Secondary | ICD-10-CM | POA: Insufficient documentation

## 2012-01-05 DIAGNOSIS — Z8639 Personal history of other endocrine, nutritional and metabolic disease: Secondary | ICD-10-CM | POA: Insufficient documentation

## 2012-01-05 DIAGNOSIS — Z79899 Other long term (current) drug therapy: Secondary | ICD-10-CM | POA: Insufficient documentation

## 2012-01-05 DIAGNOSIS — W010XXA Fall on same level from slipping, tripping and stumbling without subsequent striking against object, initial encounter: Secondary | ICD-10-CM | POA: Insufficient documentation

## 2012-01-05 DIAGNOSIS — Z8719 Personal history of other diseases of the digestive system: Secondary | ICD-10-CM | POA: Insufficient documentation

## 2012-01-05 DIAGNOSIS — Z8659 Personal history of other mental and behavioral disorders: Secondary | ICD-10-CM | POA: Insufficient documentation

## 2012-01-05 DIAGNOSIS — Y939 Activity, unspecified: Secondary | ICD-10-CM | POA: Insufficient documentation

## 2012-01-05 DIAGNOSIS — I10 Essential (primary) hypertension: Secondary | ICD-10-CM | POA: Insufficient documentation

## 2012-01-05 NOTE — ED Notes (Signed)
Spoke with Mrs. Neldon Labella from [facility] - states pt is next on list to be transported back to facility.

## 2012-01-05 NOTE — ED Notes (Signed)
Pt dressed and given PO fluids.  [facility] called for transport back to [facility].

## 2012-01-05 NOTE — ED Notes (Signed)
Removed pt from [facility] - pt had small hard bm.  Disimpacted pt with large hard BM.  Cleaned and dried pt.  nad noted.  c-collar still in place.

## 2012-01-05 NOTE — ED Notes (Signed)
Feb pt. Ate 1/2 of her plate of beef stew and whole cup of orange sherbert.

## 2012-01-05 NOTE — ED Notes (Signed)
Pt cleaned and dried.  Provided clean linen.  Repositioned in bed.  nad noted.

## 2012-01-05 NOTE — ED Notes (Signed)
Resident at [facility] facility - staff reports pt was found in floor of dining room floor.  States pt "wiggled" out of her chair.  lsb and c-collar upon arrival.

## 2012-01-05 NOTE — ED Provider Notes (Signed)
History     CSN: [id number]  Arrival date & time 01/05/12  8469   First MD Initiated Contact with Patient 01/05/12 1227      Chief Complaint  Patient presents with  . Fall/lsb     The history is provided by the EMS personnel and the nursing home. History Limited By: Hx dementia.  Pt was seen at 1255.  Per EMS and NH report, pt was found on the floor of the dining room PTA.  States pt "wiggled" out of her chair and slipped to the floor. No reported LOC. Pt with significant hx dementia.    Past Medical History  Diagnosis Date  . Dementia   . GERD (gastroesophageal reflux disease)   . Hypokalemia   . Hip fracture   . Anxiety   . Depression   . Hypertension     History reviewed. No pertinent past surgical history.   History  Substance Use Topics  . Smoking status: Never Smoker   . Smokeless tobacco: Not on file  . Alcohol Use: No      Review of Systems  Unable to perform ROS: Dementia    Allergies  Review of patient's allergies indicates not on file.  Home Medications   Current Outpatient Rx  Name  Route  Sig  Dispense  Refill  . ACETAMINOPHEN ER 650 MG PO TBCR   Oral   Take 650 mg by mouth 3 (three) times daily.         . ASPIRIN 81 MG PO CHEW   Oral   Chew 81 mg by mouth daily.           . BUSPIRONE HCL 5 MG PO TABS   Oral   Take 5 mg by mouth 3 (three) times daily.         Marland Kitchen CALCIUM CARB-CHOLECALCIFEROL 500-600 MG-UNIT PO CHEW   Oral   Chew 1 each by mouth 3 (three) times daily.           Marland Kitchen CEFUROXIME AXETIL 500 MG PO TABS   Oral   Take 1 tablet (500 mg total) by mouth 2 (two) times daily with a meal.   6 tablet   0   . MEGESTROL ACETATE 625 MG/5ML PO SUSP   Oral   Take 625 mg by mouth daily.         . ADULT MULTIVITAMIN LIQUID CH   Oral   Take 5 mLs by mouth daily.         Marland Kitchen RIVASTIGMINE TARTRATE 3 MG PO CAPS   Oral   Take 3 mg by mouth 2 (two) times daily.           . TRAZODONE HCL 150 MG PO TABS   Oral   Take 150  mg by mouth at bedtime.           Marland Kitchen UNABLE TO FIND   Oral   Take 120 mLs by mouth 4 (four) times daily. Med Name: MIGHTY SHAKE           BP 139/96  Pulse 93  Temp 98.8 F (37.1 C) (Oral)  Resp 18  SpO2 87%  Physical Exam 1300: Physical examination: Vital signs and O2 SAT: Reviewed; Constitutional: Thin, frail, In no acute distress; Head and Face: Normocephalic, Atraumatic; Eyes: EOMI, PERRL, No scleral icterus; ENMT: Mouth and pharynx normal, Left TM normal, Right TM normal, Mucous membranes dry; Neck: Immobilized in C-collar, Trachea midline; Spine: Immobilized on spineboard, No midline CS, TS, LS tenderness.;  Cardiovascular: Regular rate and rhythm, No gallop; Respiratory: Breath sounds clear & equal bilaterally, No wheezes, Normal respiratory effort/excursion; Chest: Nontender, No deformity, Movement normal, No crepitus, No abrasions or ecchymosis.; Abdomen: Soft, Nontender, Nondistended, Normal bowel sounds, No abrasions or ecchymosis.; Genitourinary: No CVA tenderness;; Extremities: No deformity, Full range of motion major/large joints of bilat UE's and LE's without pain or tenderness to palp, Neurovascularly intact, Pulses normal, No tenderness, No edema, Pelvis stable; Neuro: Awake, alert, confused re: time, place, events per hx dementia. Major CN grossly intact. Speech clear. No facial droop. Moves all ext spontaneously and to command without apparent gross focal motor deficits.; Skin: Color normal, Warm, Dry.     ED Course  Procedures   1305:  Pt arrived to ED with LSB and c-collar in place.  Multiple ED staff at bedside to log roll pt off LSB while maintaining cervical spinal immobilization.  LSB removed, c-collar remains in place.  1450:  No apparent midline CS tenderness, FROM CS without midline tenderness. Hx dementia, moves all ext on stretcher spontaneously without apparent gross focal motor weakness.  Pt is grabbing at the side rales and shaking them forcefully with her  bilat UE's.  C-collar removed.  L1 fx is age indeterminate.  Pt without specific midline CS/TS/LS tenderness on exam.  Tx symptomatically at this time.  Will sent pt back to NH.    MDM  MDM Reviewed: nursing note, vitals and previous chart Interpretation: x-ray and CT scan      Dg Chest 1 View 01/05/2012  *RADIOLOGY REPORT*  Clinical Data: Fall  CHEST - 1 VIEW  Comparison: None.  Findings: Lungs are hyperaerated.  Cardiomediastinal silhouette is within normal limits.  Calcified granuloma at the right base.  Ill- defined nodular density at the lateral right base.  No pneumothorax.  No acute bony deformity.  L1 compression fracture.  IMPRESSION: Nodular opacity at the right base.  Repeat with nipple markers is recommended.  Otherwise, no active cardiopulmonary disease.  Hyperaeration likely related to COPD.  L1 compression fracture.   Original Report Authenticated By: Jolaine Click, M.D.    Dg Lumbar Spine Complete 01/05/2012  *RADIOLOGY REPORT*  Clinical Data: Back pain status post fall.  LUMBAR SPINE - COMPLETE 4+ VIEW  Comparison: None.  Findings: There are five lumbar type vertebral bodies demonstrating a convex right scoliosis centered at L2.  The bones are diffusely demineralized.  There is an approximately 30% superior endplate compression deformity at L1 which is age indeterminate.  No other definite fractures are seen allowing for the scoliosis.  There is mild disc space loss at T11-T12.  There are facet degenerative changes inferiorly.  Aorto iliac atherosclerosis is noted.  IMPRESSION: Age indeterminate superior endplate compression deformity at L1. Generalized osteopenia and convex right scoliosis noted.   Original Report Authenticated By: Carey Bullocks, M.D.    Dg Pelvis 1-2 Views 01/05/2012  *RADIOLOGY REPORT*  Clinical Data: Pain status post fall.  PELVIS - 1-2 VIEW  Comparison: 11/07/2011 radiographs.  Findings: The bones are demineralized.  There is no evidence of acute fracture or  dislocation.  The patient is status post right hip hemiarthroplasty.  The femoral component is incompletely visualized.  There are stable degenerative changes at the sacroiliac joints and throughout the lower lumbar spine.  A calcification overlying the sacrum is unchanged.  IMPRESSION: No evidence of acute pelvic fracture or dislocation.   Original Report Authenticated By: Carey Bullocks, M.D.    Ct Head Wo Contrast 01/05/2012  *RADIOLOGY REPORT*  Clinical Data:  71 year old female found down.  Dementia.  CT HEAD WITHOUT CONTRAST CT CERVICAL SPINE WITHOUT CONTRAST  Technique:  Multidetector CT imaging of the head and cervical spine was performed following the standard protocol without intravenous contrast.  Multiplanar CT image reconstructions of the cervical spine were also generated.  Comparison:  11/07/2011.  CT HEAD  Findings: Visualized paranasal sinuses and mastoids are clear. Visualized orbit soft tissues are within normal limits.  No scalp hematoma identified.  Mild motion artifact despite repeated imaging attempts.  No calvarial fracture identified.  Mild Calcified atherosclerosis at the skull base.  Stable supratentorial cerebral volume loss. Stable ventricle size and configuration.  No midline shift, mass effect, or evidence of mass lesion.  Confluent cerebral white matter hypodensity is stable.  Stable basal ganglia dystrophic calcifications. No acute intracranial hemorrhage identified.  No evidence of cortically based acute infarction identified.  No suspicious intracranial vascular hyperdensity.  IMPRESSION: 1. Stable noncontrast CT appearance of the brain.  No acute traumatic injury identified. 2.  Cervical spine findings are below.  CT CERVICAL SPINE  Findings: Degenerative partially calcified ligamentous hypertrophy about the odontoid.  Mild motion artifact despite repeated imaging attempts.  Stable cervical vertebral height and alignment. Visualized skull base is intact.  No atlanto-occipital  dissociation.  Stable cervicothoracic junction alignment. Bilateral posterior element alignment is within normal limits.  Facet ankylosis on the left at C5-C6. Multilevel chronic cervical disc degeneration.  Partially calcified chronic disc herniation at C3- C4.  Osteopenia.  No acute cervical spine fracture identified. Chronic apical pulmonary scarring with some calcification. Visualized paraspinal soft tissues are within normal limits.  IMPRESSION: No acute fracture or listhesis identified in the cervical spine. Ligamentous injury is not excluded.   Original Report Authenticated By: Erskine Speed, M.D.    Ct Cervical Spine Wo Contrast 01/05/2012  *RADIOLOGY REPORT*  Clinical Data:  71 year old female found down.  Dementia.  CT HEAD WITHOUT CONTRAST CT CERVICAL SPINE WITHOUT CONTRAST  Technique:  Multidetector CT imaging of the head and cervical spine was performed following the standard protocol without intravenous contrast.  Multiplanar CT image reconstructions of the cervical spine were also generated.  Comparison:  11/07/2011.  CT HEAD  Findings: Visualized paranasal sinuses and mastoids are clear. Visualized orbit soft tissues are within normal limits.  No scalp hematoma identified.  Mild motion artifact despite repeated imaging attempts.  No calvarial fracture identified.  Mild Calcified atherosclerosis at the skull base.  Stable supratentorial cerebral volume loss. Stable ventricle size and configuration.  No midline shift, mass effect, or evidence of mass lesion.  Confluent cerebral white matter hypodensity is stable.  Stable basal ganglia dystrophic calcifications. No acute intracranial hemorrhage identified.  No evidence of cortically based acute infarction identified.  No suspicious intracranial vascular hyperdensity.  IMPRESSION: 1. Stable noncontrast CT appearance of the brain.  No acute traumatic injury identified. 2.  Cervical spine findings are below.  CT CERVICAL SPINE  Findings: Degenerative  partially calcified ligamentous hypertrophy about the odontoid.  Mild motion artifact despite repeated imaging attempts.  Stable cervical vertebral height and alignment. Visualized skull base is intact.  No atlanto-occipital dissociation.  Stable cervicothoracic junction alignment. Bilateral posterior element alignment is within normal limits.  Facet ankylosis on the left at C5-C6. Multilevel chronic cervical disc degeneration.  Partially calcified chronic disc herniation at C3- C4.  Osteopenia.  No acute cervical spine fracture identified. Chronic apical pulmonary scarring with some calcification. Visualized paraspinal soft tissues are within normal limits.  IMPRESSION:  No acute fracture or listhesis identified in the cervical spine. Ligamentous injury is not excluded.   Original Report Authenticated By: Erskine Speed, M.D.    Dg Chest Port 1 View 01/05/2012  *RADIOLOGY REPORT*  Clinical Data: Repeat chest x-ray requested with nipple markers.  PORTABLE CHEST - 1 VIEW  Comparison: Chest x-ray 01/05/2012.  Findings: The previously questioned nodular opacity corresponds with the patient's right nipple.  No other suspicious appearing pulmonary nodules or masses are identified.  No consolidative airspace disease.  No pleural effusions.  Pulmonary vasculature and the cardiomediastinal silhouette are within normal limits. Atherosclerosis in the thoracic aorta.  IMPRESSION: 1.  No radiographic evidence of acute cardiopulmonary disease. 2.  The previously questioned nodular opacity corresponds with the patient's right nipple.  No suspicious nodules identified.   Original Report Authenticated By: Trudie Reed, M.D.            Laray Anger, DO 01/07/12 1333

## 2012-11-23 ENCOUNTER — Emergency Department (HOSPITAL_COMMUNITY): Payer: Medicare Other

## 2012-11-23 ENCOUNTER — Encounter (HOSPITAL_COMMUNITY): Payer: Self-pay | Admitting: Emergency Medicine

## 2012-11-23 ENCOUNTER — Emergency Department (HOSPITAL_COMMUNITY)
Admission: EM | Admit: 2012-11-23 | Discharge: 2012-11-23 | Disposition: A | Discharge status: Home / Self Care | Payer: Medicare Other | Attending: Emergency Medicine | Admitting: Emergency Medicine

## 2012-11-23 DIAGNOSIS — F329 Major depressive disorder, single episode, unspecified: Secondary | ICD-10-CM | POA: Insufficient documentation

## 2012-11-23 DIAGNOSIS — Z862 Personal history of diseases of the blood and blood-forming organs and certain disorders involving the immune mechanism: Secondary | ICD-10-CM | POA: Insufficient documentation

## 2012-11-23 DIAGNOSIS — Z7982 Long term (current) use of aspirin: Secondary | ICD-10-CM | POA: Insufficient documentation

## 2012-11-23 DIAGNOSIS — W19XXXA Unspecified fall, initial encounter: Secondary | ICD-10-CM

## 2012-11-23 DIAGNOSIS — K219 Gastro-esophageal reflux disease without esophagitis: Secondary | ICD-10-CM | POA: Insufficient documentation

## 2012-11-23 DIAGNOSIS — Z23 Encounter for immunization: Secondary | ICD-10-CM | POA: Insufficient documentation

## 2012-11-23 DIAGNOSIS — S0083XA Contusion of other part of head, initial encounter: Secondary | ICD-10-CM

## 2012-11-23 DIAGNOSIS — S0003XA Contusion of scalp, initial encounter: Secondary | ICD-10-CM | POA: Insufficient documentation

## 2012-11-23 DIAGNOSIS — Z8781 Personal history of (healed) traumatic fracture: Secondary | ICD-10-CM | POA: Insufficient documentation

## 2012-11-23 DIAGNOSIS — F3289 Other specified depressive episodes: Secondary | ICD-10-CM | POA: Insufficient documentation

## 2012-11-23 DIAGNOSIS — IMO0002 Reserved for concepts with insufficient information to code with codable children: Secondary | ICD-10-CM

## 2012-11-23 DIAGNOSIS — I10 Essential (primary) hypertension: Secondary | ICD-10-CM | POA: Insufficient documentation

## 2012-11-23 DIAGNOSIS — F411 Generalized anxiety disorder: Secondary | ICD-10-CM | POA: Insufficient documentation

## 2012-11-23 DIAGNOSIS — Z79899 Other long term (current) drug therapy: Secondary | ICD-10-CM | POA: Insufficient documentation

## 2012-11-23 DIAGNOSIS — R296 Repeated falls: Secondary | ICD-10-CM | POA: Insufficient documentation

## 2012-11-23 DIAGNOSIS — Z8639 Personal history of other endocrine, nutritional and metabolic disease: Secondary | ICD-10-CM | POA: Insufficient documentation

## 2012-11-23 DIAGNOSIS — Y9389 Activity, other specified: Secondary | ICD-10-CM | POA: Insufficient documentation

## 2012-11-23 DIAGNOSIS — S0180XA Unspecified open wound of other part of head, initial encounter: Secondary | ICD-10-CM | POA: Insufficient documentation

## 2012-11-23 DIAGNOSIS — Y9210 Unspecified residential institution as the place of occurrence of the external cause: Secondary | ICD-10-CM | POA: Insufficient documentation

## 2012-11-23 DIAGNOSIS — F0390 Unspecified dementia without behavioral disturbance: Secondary | ICD-10-CM | POA: Insufficient documentation

## 2012-11-23 DIAGNOSIS — S01409A Unspecified open wound of unspecified cheek and temporomandibular area, initial encounter: Secondary | ICD-10-CM | POA: Insufficient documentation

## 2012-11-23 MED ORDER — BACITRACIN ZINC 500 UNIT/GM EX OINT
TOPICAL_OINTMENT | CUTANEOUS | Status: AC
  Administered 2012-11-23: 1
  Filled 2012-11-23: qty 0.9

## 2012-11-23 MED ORDER — TETANUS-DIPHTH-ACELL PERTUSSIS 5-2.5-18.5 LF-MCG/0.5 IM SUSP
0.5000 mL | Freq: Once | INTRAMUSCULAR | Status: AC
  Administered 2012-11-23: 0.5 mL via INTRAMUSCULAR
  Filled 2012-11-23: qty 0.5

## 2012-11-23 NOTE — ED Provider Notes (Signed)
CSN: [id number]     Arrival date & time 11/23/12  0713 History   This chart was scribed for Glynn Octave, MD, by Yevette Edwards, ED Scribe. This patient was seen in room APA14/APA14 and the patient's care was started at 7:19 AM.  First MD Initiated Contact with Patient 11/23/12 [id number]     Chief Complaint  Patient presents with  . Fall  . Facial Laceration   Level Five Caveat (Dementia)  The history is provided by the patient, the nursing home and medical records. The history is limited by the condition of the patient. No language interpreter was used.   HPI Comments: Jillian King is a 72 y.o. female, brought in via EMS from [facility], who presents to the Emergency Department due to an unwitnessed fall which occurred at an unknown time yesterday evening. She was found lying beside her bed this morning by the staff at [facility]. According to staff, she is acting at baseline.   Past Medical History  Diagnosis Date  . Dementia   . GERD (gastroesophageal reflux disease)   . Hypokalemia   . Hip fracture   . Anxiety   . Depression   . Hypertension    History reviewed. No pertinent past surgical history. No family history on file. History  Substance Use Topics  . Smoking status: Never Smoker   . Smokeless tobacco: Not on file  . Alcohol Use: No   No OB history provided.  Review of Systems  Unable to perform ROS: Dementia    Allergies  Review of patient's allergies indicates no known allergies.  Home Medications   Current Outpatient Rx  Name  Route  Sig  Dispense  Refill  . acetaminophen (TYLENOL) 650 MG CR tablet   Oral   Take 650 mg by mouth 2 (two) times daily.          Marland Kitchen aspirin 81 MG chewable tablet   Oral   Chew 81 mg by mouth daily.           . busPIRone (BUSPAR) 5 MG tablet   Oral   Take 5 mg by mouth 2 (two) times daily.          . Calcium Carb-Cholecalciferol (CALCIUM 600 + D PO)   Oral   Take 1 tablet by mouth 3 (three) times daily.          . Multiple Vitamin (MULTIVITAMIN) LIQD   Oral   Take 5 mLs by mouth daily.         . Nutritional Supplements (NUTRITIONAL SHAKE PO)   Oral   Take 4 oz by mouth 4 (four) times daily.         . rivastigmine (EXELON) 3 MG capsule   Oral   Take 3 mg by mouth 2 (two) times daily.           . traZODone (DESYREL) 150 MG tablet   Oral   Take 150 mg by mouth at bedtime.           Marland Kitchen zinc oxide 20 % ointment   Topical   Apply 1 application topically every 2 (two) hours. With toileting          Triage Vitals: BP 103/65  Pulse 73  Resp 16  SpO2 98%  Physical Exam  Nursing note and vitals reviewed. Constitutional: She appears well-nourished. No distress.  HENT:  Head: Normocephalic.  Mouth/Throat: Oropharynx is clear and moist. No oropharyngeal exudate.  Eyes: Conjunctivae and EOM are normal. Pupils are  equal, round, and reactive to light.  Right-sided periorbital ecchymosis and edema.  2 cm horizontal laceraction on right eyebrow.  Small 1/2 cm laceration to right lower eyelid not involving lid margin. EOM intact.     Neck: Normal range of motion. Neck supple. No tracheal deviation present.  Cardiovascular: Normal rate, regular rhythm and normal heart sounds.   Pulmonary/Chest: Effort normal and breath sounds normal. No respiratory distress. She has no wheezes.  Abdominal: Soft. There is no tenderness. There is no rebound and no guarding.  Musculoskeletal: Normal range of motion. She exhibits no tenderness.  No C-spine pain. Moving all extremities.  Able to range pt's limbs without apparent pain.   Neurological: She is alert.  Does not follow commands.  Moving all extremities  Skin: Skin is warm and dry.    ED Course  LACERATION REPAIR Date/Time: 11/23/2012 7:39 AM Performed by: Glynn Octave Authorized by: Glynn Octave Consent: Verbal consent obtained. Risks and benefits: risks, benefits and alternatives were discussed Consent given by:  patient Patient identity confirmed: verbally with patient and arm band Time out: Immediately prior to procedure a "time out" was called to verify the correct patient, procedure, equipment, support staff and site/side marked as required. Body area: head/neck Location details: right eyebrow Laceration length: 2 cm Tendon involvement: none Nerve involvement: none Vascular damage: no Local anesthetic: topical anesthetic Patient sedated: no Irrigation solution: tap water Irrigation method: syringe Amount of cleaning: standard Debridement: none Degree of undermining: none Skin closure: glue Technique: simple Approximation: close Approximation difficulty: simple Dressing: antibiotic ointment and 4x4 sterile gauze Patient tolerance: Patient tolerated the procedure well with no immediate complications.  LACERATION REPAIR Date/Time: 11/23/2012 7:54 AM Performed by: Glynn Octave Authorized by: Glynn Octave Consent: Verbal consent obtained. Risks and benefits: risks, benefits and alternatives were discussed Consent given by: patient Patient identity confirmed: verbally with patient Time out: Immediately prior to procedure a "time out" was called to verify the correct patient, procedure, equipment, support staff and site/side marked as required. Body area: head/neck Location details: right cheek Laceration length: 0.5 cm Tendon involvement: none Nerve involvement: none Vascular damage: no Local anesthetic: topical anesthetic Patient sedated: no Irrigation solution: saline Irrigation method: syringe Amount of cleaning: standard Debridement: none Degree of undermining: none Skin closure: glue Technique: simple Approximation: close Approximation difficulty: simple Dressing: 4x4 sterile gauze and antibiotic ointment Patient tolerance: Patient tolerated the procedure well with no immediate complications.   (including critical care time)  DIAGNOSTIC STUDIES: Oxygen  Saturation is 98% on room air, normal by my interpretation.    COORDINATION OF CARE:  7:28 AM- Discussed treatment plan with patient, and the patient agreed to the plan.   7:33 AM- Applied glue to right supraorbital laceration.   8:04 AM- Rechecked pt.   Labs Review Labs Reviewed - No data to display Imaging Review Dg Chest 1 View  11/23/2012   CLINICAL DATA:  Recent fall, dementia  EXAM: CHEST - 1 VIEW  COMPARISON:  Portable chest x-ray of 01/05/2012  FINDINGS: No active infiltrate or effusion is seen. Nipple shadows are again noted at the lung bases. Mild cardiomegaly is stable and mediastinal contours are stable. No acute bony abnormality is seen.  IMPRESSION: No active lung disease. Stable mild cardiomegaly.   Electronically Signed   By: Dwyane Dee M.D.   On: 11/23/2012 08:15   Ct Head Wo Contrast  11/23/2012   CLINICAL DATA:  Fall with injury to the face and a left periorbital laceration. Study is limited  by patient motion.  EXAM: CT HEAD WITHOUT CONTRAST  CT MAXILLOFACIAL WITHOUT CONTRAST  CT CERVICAL SPINE WITHOUT CONTRAST  TECHNIQUE: Multidetector CT imaging of the head, cervical spine, and maxillofacial structures were performed using the standard protocol without intravenous contrast. Multiplanar CT image reconstructions of the cervical spine and maxillofacial structures were also generated.  COMPARISON:  01/05/2012  FINDINGS: CT HEAD FINDINGS  The ventricles are enlarged, somewhat greater in degree than the sulcal enlargement. This is stable most likely due to a atrophy.  No parenchymal masses or mass effect. Patchy white matter hypoattenuation is noted consistent with moderate chronic microvascular ischemic change. No evidence of a recent cortical infarct.  No extra-axial masses or abnormal fluid collections.  There is no intracranial hemorrhage.  Right periorbital soft tissue swelling. No fracture is seen. The visualized sinuses and mastoid air cells are clear.  CT MAXILLOFACIAL  FINDINGS  No fracture. It should be noted that motion significantly limits evaluation of the mandible.  There is right periorbital, preseptal soft tissue swelling/hemorrhage. The right globe and postseptal right orbit are unremarkable.  No soft tissue masses. No adenopathy.  The sinuses, mastoid air cells and middle ear cavities are clear.  CT CERVICAL SPINE FINDINGS  No fracture or spondylolisthesis.  Mild loss of disk height at C3-C4 with moderate loss of disk height at C4-C5, C5-C6 and C6-C7. There is disc bulging and uncovertebral spurring at these levels with varying degrees of neural foraminal narrowing. This is all stable. The bones are demineralized.  The soft tissues are unremarkable. There is stable scarring at the lung apices.  IMPRESSION: Head CT:  1. No acute intracranial abnormality. No skull fracture. Maxillofacial CT:  1. No fracture. 2. Right, preseptal periorbital soft tissue swelling/hemorrhage. Cervical CT:  1. No fracture or acute finding.   Electronically Signed   By: Amie Portland M.D.   On: 11/23/2012 08:25   Ct Cervical Spine Wo Contrast  11/23/2012   CLINICAL DATA:  Fall with injury to the face and a left periorbital laceration. Study is limited by patient motion.  EXAM: CT HEAD WITHOUT CONTRAST  CT MAXILLOFACIAL WITHOUT CONTRAST  CT CERVICAL SPINE WITHOUT CONTRAST  TECHNIQUE: Multidetector CT imaging of the head, cervical spine, and maxillofacial structures were performed using the standard protocol without intravenous contrast. Multiplanar CT image reconstructions of the cervical spine and maxillofacial structures were also generated.  COMPARISON:  01/05/2012  FINDINGS: CT HEAD FINDINGS  The ventricles are enlarged, somewhat greater in degree than the sulcal enlargement. This is stable most likely due to a atrophy.  No parenchymal masses or mass effect. Patchy white matter hypoattenuation is noted consistent with moderate chronic microvascular ischemic change. No evidence of a recent  cortical infarct.  No extra-axial masses or abnormal fluid collections.  There is no intracranial hemorrhage.  Right periorbital soft tissue swelling. No fracture is seen. The visualized sinuses and mastoid air cells are clear.  CT MAXILLOFACIAL FINDINGS  No fracture. It should be noted that motion significantly limits evaluation of the mandible.  There is right periorbital, preseptal soft tissue swelling/hemorrhage. The right globe and postseptal right orbit are unremarkable.  No soft tissue masses. No adenopathy.  The sinuses, mastoid air cells and middle ear cavities are clear.  CT CERVICAL SPINE FINDINGS  No fracture or spondylolisthesis.  Mild loss of disk height at C3-C4 with moderate loss of disk height at C4-C5, C5-C6 and C6-C7. There is disc bulging and uncovertebral spurring at these levels with varying degrees of neural foraminal  narrowing. This is all stable. The bones are demineralized.  The soft tissues are unremarkable. There is stable scarring at the lung apices.  IMPRESSION: Head CT:  1. No acute intracranial abnormality. No skull fracture. Maxillofacial CT:  1. No fracture. 2. Right, preseptal periorbital soft tissue swelling/hemorrhage. Cervical CT:  1. No fracture or acute finding.   Electronically Signed   By: Amie Portland M.D.   On: 11/23/2012 08:25   Dg Pelvis Portable  11/23/2012   CLINICAL DATA:  Fall, dementia  EXAM: PORTABLE PELVIS 1-2 VIEWS  COMPARISON:  01/05/12  FINDINGS: Single frontal view of the pelvis submitted. Diffuse osteopenia. No gross fracture or subluxation. Again noted right hip prosthesis in anatomic alignment. Dextroscoliosis and degenerative changes lumbar spine are stable.  IMPRESSION: No gross fracture or subluxation. Diffuse osteopenia. 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 Dextroscoliosis lumbar spine.   Electronically Signed   By: Natasha Mead M.D.   On: 11/23/2012 08:35   Ct Maxillofacial Wo Cm  11/23/2012   CLINICAL DATA:  Fall with injury to the face  and a left periorbital laceration. Study is limited by patient motion.  EXAM: CT HEAD WITHOUT CONTRAST  CT MAXILLOFACIAL WITHOUT CONTRAST  CT CERVICAL SPINE WITHOUT CONTRAST  TECHNIQUE: Multidetector CT imaging of the head, cervical spine, and maxillofacial structures were performed using the standard protocol without intravenous contrast. Multiplanar CT image reconstructions of the cervical spine and maxillofacial structures were also generated.  COMPARISON:  01/05/2012  FINDINGS: CT HEAD FINDINGS  The ventricles are enlarged, somewhat greater in degree than the sulcal enlargement. This is stable most likely due to a atrophy.  No parenchymal masses or mass effect. Patchy white matter hypoattenuation is noted consistent with moderate chronic microvascular ischemic change. No evidence of a recent cortical infarct.  No extra-axial masses or abnormal fluid collections.  There is no intracranial hemorrhage.  Right periorbital soft tissue swelling. No fracture is seen. The visualized sinuses and mastoid air cells are clear.  CT MAXILLOFACIAL FINDINGS  No fracture. It should be noted that motion significantly limits evaluation of the mandible.  There is right periorbital, preseptal soft tissue swelling/hemorrhage. The right globe and postseptal right orbit are unremarkable.  No soft tissue masses. No adenopathy.  The sinuses, mastoid air cells and middle ear cavities are clear.  CT CERVICAL SPINE FINDINGS  No fracture or spondylolisthesis.  Mild loss of disk height at C3-C4 with moderate loss of disk height at C4-C5, C5-C6 and C6-C7. There is disc bulging and uncovertebral spurring at these levels with varying degrees of neural foraminal narrowing. This is all stable. The bones are demineralized.  The soft tissues are unremarkable. There is stable scarring at the lung apices.  IMPRESSION: Head CT:  1. No acute intracranial abnormality. No skull fracture. Maxillofacial CT:  1. No fracture. 2. Right, preseptal periorbital  soft tissue swelling/hemorrhage. Cervical CT:  1. No fracture or acute finding.   Electronically Signed   By: Amie Portland M.D.   On: 11/23/2012 08:25    EKG Interpretation   None       MDM   1. Fall, initial encounter   2. Facial contusion, initial encounter   3. Laceration    From [facility]CF after unwitnessed fal. Found on floor with bruising and laceration around eye.  At baseline mental status per staff.   globes appear intact bilaterally pupils are equal. Extraocular movements are intact. Patient is demented and uncooperative but is moving her eyes freely.  Lacerations repaired with tissue  adhesive as above.  Tetanus updated.  Imaging is negative for acute traumatic injury. No intracranial hemorrhage. No facial fractures. No C-spine injury.  Patient appears stable for discharge back to facility.   I personally performed the services described in this documentation, which was scribed in my presence. The recorded information has been reviewed and is accurate.   Date: 11/23/2012  Rate: 67  Rhythm: normal sinus rhythm  QRS Axis: normal  Intervals: normal  ST/T Wave abnormalities: normal  Conduction Disutrbances:none  Narrative Interpretation:   Old EKG Reviewed: unchanged    Glynn Octave, MD 11/23/12 [phone]

## 2012-11-23 NOTE — ED Notes (Signed)
Pt taken to radiology. EKG on hold, RN notified.

## 2012-11-23 NOTE — ED Notes (Signed)
Pt was discharged back to facility and not home/self care. Error in charting on d/c screen.

## 2012-11-23 NOTE — ED Notes (Signed)
Pt is a resident of [facility]. Staff found her lying beside the bed this morning. Lac to right brow with bruising around eye.

## 2012-11-23 NOTE — ED Notes (Addendum)
Dr. Manus Gunning applied dermabond to 2cm lac on right eye brow and to smaller lac noted to right cheek. No active bleeding noted. Pt tolerated well.

## 2012-11-23 NOTE — ED Notes (Signed)
[facility] called for transport back to [facility].

## 2013-05-05 ENCOUNTER — Emergency Department (HOSPITAL_COMMUNITY)
Admission: EM | Admit: 2013-05-05 | Discharge: 2013-05-05 | Disposition: A | Discharge status: Home / Self Care | Payer: Medicare Other | Attending: Emergency Medicine | Admitting: Emergency Medicine

## 2013-05-05 ENCOUNTER — Emergency Department (HOSPITAL_COMMUNITY): Payer: Medicare Other

## 2013-05-05 ENCOUNTER — Encounter (HOSPITAL_COMMUNITY): Payer: Self-pay | Admitting: Emergency Medicine

## 2013-05-05 DIAGNOSIS — F0390 Unspecified dementia without behavioral disturbance: Secondary | ICD-10-CM | POA: Insufficient documentation

## 2013-05-05 DIAGNOSIS — Z8639 Personal history of other endocrine, nutritional and metabolic disease: Secondary | ICD-10-CM | POA: Insufficient documentation

## 2013-05-05 DIAGNOSIS — Z79899 Other long term (current) drug therapy: Secondary | ICD-10-CM | POA: Insufficient documentation

## 2013-05-05 DIAGNOSIS — F411 Generalized anxiety disorder: Secondary | ICD-10-CM | POA: Insufficient documentation

## 2013-05-05 DIAGNOSIS — Z7982 Long term (current) use of aspirin: Secondary | ICD-10-CM | POA: Insufficient documentation

## 2013-05-05 DIAGNOSIS — I10 Essential (primary) hypertension: Secondary | ICD-10-CM | POA: Insufficient documentation

## 2013-05-05 DIAGNOSIS — F3289 Other specified depressive episodes: Secondary | ICD-10-CM | POA: Insufficient documentation

## 2013-05-05 DIAGNOSIS — Z8781 Personal history of (healed) traumatic fracture: Secondary | ICD-10-CM | POA: Insufficient documentation

## 2013-05-05 DIAGNOSIS — Z8719 Personal history of other diseases of the digestive system: Secondary | ICD-10-CM | POA: Insufficient documentation

## 2013-05-05 DIAGNOSIS — N390 Urinary tract infection, site not specified: Secondary | ICD-10-CM | POA: Insufficient documentation

## 2013-05-05 DIAGNOSIS — Z862 Personal history of diseases of the blood and blood-forming organs and certain disorders involving the immune mechanism: Secondary | ICD-10-CM | POA: Insufficient documentation

## 2013-05-05 DIAGNOSIS — F329 Major depressive disorder, single episode, unspecified: Secondary | ICD-10-CM | POA: Insufficient documentation

## 2013-05-05 LAB — URINALYSIS, ROUTINE W REFLEX MICROSCOPIC
Bilirubin Urine: NEGATIVE
Glucose, UA: NEGATIVE mg/dL
Ketones, ur: NEGATIVE mg/dL
NITRITE: NEGATIVE
PH: 7.5 (ref 5.0–8.0)
Protein, ur: NEGATIVE mg/dL
SPECIFIC GRAVITY, URINE: 1.02 (ref 1.005–1.030)
UROBILINOGEN UA: 0.2 mg/dL (ref 0.0–1.0)

## 2013-05-05 LAB — CBC WITH DIFFERENTIAL/PLATELET
Basophils Absolute: 0 10*3/uL (ref 0.0–0.1)
Basophils Relative: 0 % (ref 0–1)
EOS PCT: 1 % (ref 0–5)
Eosinophils Absolute: 0.1 10*3/uL (ref 0.0–0.7)
HCT: 39.1 % (ref 36.0–46.0)
Hemoglobin: 12.7 g/dL (ref 12.0–15.0)
LYMPHS ABS: 1.4 10*3/uL (ref 0.7–4.0)
Lymphocytes Relative: 13 % (ref 12–46)
MCH: 31.2 pg (ref 26.0–34.0)
MCHC: 32.5 g/dL (ref 30.0–36.0)
MCV: 96.1 fL (ref 78.0–100.0)
MONO ABS: 1 10*3/uL (ref 0.1–1.0)
Monocytes Relative: 9 % (ref 3–12)
Neutro Abs: 7.9 10*3/uL — ABNORMAL HIGH (ref 1.7–7.7)
Neutrophils Relative %: 77 % (ref 43–77)
Platelets: 219 10*3/uL (ref 150–400)
RBC: 4.07 MIL/uL (ref 3.87–5.11)
RDW: 12.8 % (ref 11.5–15.5)
WBC: 10.3 10*3/uL (ref 4.0–10.5)

## 2013-05-05 LAB — COMPREHENSIVE METABOLIC PANEL
ALT: 12 U/L (ref 0–35)
AST: 22 U/L (ref 0–37)
Albumin: 3.8 g/dL (ref 3.5–5.2)
Alkaline Phosphatase: 85 U/L (ref 39–117)
BUN: 20 mg/dL (ref 6–23)
CALCIUM: 9.9 mg/dL (ref 8.4–10.5)
CO2: 29 mEq/L (ref 19–32)
CREATININE: 0.95 mg/dL (ref 0.50–1.10)
Chloride: 104 mEq/L (ref 96–112)
GFR calc Af Amer: 68 mL/min — ABNORMAL LOW (ref >90)
GFR calc non Af Amer: 58 mL/min — ABNORMAL LOW (ref >90)
GLUCOSE: 127 mg/dL — AB (ref 70–99)
Potassium: 4.1 mEq/L (ref 3.7–5.3)
SODIUM: 144 meq/L (ref 137–147)
Total Bilirubin: 0.3 mg/dL (ref 0.3–1.2)
Total Protein: 7.6 g/dL (ref 6.0–8.3)

## 2013-05-05 LAB — URINE MICROSCOPIC-ADD ON

## 2013-05-05 MED ORDER — FOSFOMYCIN TROMETHAMINE 3 G PO PACK
3.0000 g | PACK | Freq: Once | ORAL | Status: AC
  Administered 2013-05-05: 3 g via ORAL
  Filled 2013-05-05: qty 3

## 2013-05-05 NOTE — Discharge Instructions (Signed)
Please return here for concerning changes in your condition.  Today's evaluation suggests that there is a urinary tract infection.  Antibiotics have been provided here.  No additional dosing is required.

## 2013-05-05 NOTE — ED Notes (Signed)
Spoke with Marylene LandAngela, pt's caregiver [facility] - states pt's last normal BM x 4 days ago, had small hard BM last night.  Pt received Milk of mag x 2 yesterday with no relief and Miralax last night with no relief.  Reports pt has not been eating normally.  Denies any v/d.  States pt has been grimacing, guarding abd upon their assessments.  Reports pt has hx of constipation and impaction.  edp notified.

## 2013-05-05 NOTE — ED Notes (Signed)
EMS called to transport back to [facility].

## 2013-05-05 NOTE — ED Notes (Signed)
[facility] resident [facility] - per EMS - staff reports pt has been "hunched over, grabbing stomach".  Staff thinks pt may be impacted with hx of constipation.  Denies n/v.

## 2013-05-05 NOTE — ED Notes (Signed)
Pt had large BM with urine incontinence.  Clean and dried pt - provided clean linen.  Peri-care done.

## 2013-05-05 NOTE — ED Provider Notes (Addendum)
CSN: [id number]     Arrival date & time 05/05/13  1023 History  This chart was scribed for Jillian King Yaqub Arney, MD by Quintella ReichertMatthew Underwood, ED scribe.  This patient was seen in room APA18/APA18 and the patient's care was started at 10:23 AM.   Chief Complaint  Patient presents with  . Abdominal Pain    The history is provided by the EMS personnel. History limited by: dementia. No language interpreter was used.    Level 5 Caveat: Dementia  HPI Comments: Jillian King is a 73 y.o. female with h/o dementia sent from nursing home to the Emergency Department for abdominal pain.  Pt is a resident of [facility].  Per EMS, staff reports pt has been "hunched over holding onto her side" recently.  She has h/o constipation and was sent in to evaluate for possible bowel impaction.  EMS denies vomiting, diarrhea, fevers, or any other associated symptoms.  Pt is at her baseline mental status per EMS.    Past Medical History  Diagnosis Date  . Dementia   . GERD (gastroesophageal reflux disease)   . Hypokalemia   . Hip fracture   . Anxiety   . Depression   . Hypertension     History reviewed. No pertinent past surgical history.  No family history on file.   History  Substance Use Topics  . Smoking status: Never Smoker   . Smokeless tobacco: Not on file  . Alcohol Use: No    OB History   Grav Para Term Preterm Abortions TAB SAB Ect Mult Living                   Review of Systems  Unable to perform ROS: Dementia      Allergies  Review of patient's allergies indicates no known allergies.  Home Medications   Prior to Admission medications   Medication Sig Start Date End Date Taking? Authorizing Provider  acetaminophen (TYLENOL) 650 MG CR tablet Take 650 mg by mouth 2 (two) times daily.     Historical Provider, MD  aspirin 81 MG chewable tablet Chew 81 mg by mouth daily.      Historical Provider, MD  busPIRone (BUSPAR) 5 MG tablet Take 5 mg by mouth 2 (two) times  daily.     Historical Provider, MD  Calcium Carb-Cholecalciferol (CALCIUM 600 + D PO) Take 1 tablet by mouth 3 (three) times daily.    Historical Provider, MD  Multiple Vitamin (MULTIVITAMIN) LIQD Take 5 mLs by mouth daily.    Historical Provider, MD  Nutritional Supplements (NUTRITIONAL SHAKE PO) Take 4 oz by mouth 4 (four) times daily.    Historical Provider, MD  rivastigmine (EXELON) 3 MG capsule Take 3 mg by mouth 2 (two) times daily.      Historical Provider, MD  traZODone (DESYREL) 150 MG tablet Take 150 mg by mouth at bedtime.      Historical Provider, MD  zinc oxide 20 % ointment Apply 1 application topically every 2 (two) hours. With toileting    Historical Provider, MD   BP 112/59  Pulse 70  Temp(Src) 98.2 F (36.8 C) (Rectal)  SpO2 97%  Physical Exam  Nursing note and vitals reviewed. Constitutional: She appears well-developed. No distress.  HENT:  Head: Normocephalic and atraumatic.  Eyes: Conjunctivae and EOM are normal.  Cardiovascular: Normal rate and regular rhythm.   Pulmonary/Chest: Effort normal and breath sounds normal. No stridor. No respiratory distress.  Abdominal: She exhibits no distension. There is  tenderness (mid-abdomen). There is no rebound and no guarding.  Non-peritoneal  Musculoskeletal: She exhibits no edema.  Skin: Skin is warm and dry.  Psychiatric: She is withdrawn. Cognition and memory are impaired.    ED Course  BLADDER CATHETERIZATION Date/Time: 05/05/2013 12:28 PM Performed by: Jillian MunchLOCKWOOD, Kanya Potteiger Authorized by: Jillian MunchLOCKWOOD, Mckinna Demars Consent: The procedure was performed in an emergent situation. Required items: required blood products, implants, devices, and special equipment available Patient identity confirmed: hospital-assigned identification number Time out: Immediately prior to procedure a "time out" was called to verify the correct patient, procedure, equipment, support staff and site/side marked as required. Indications: urine specimen  collection Local anesthesia used: no Patient sedated: no Preparation: Patient was prepped and draped in the usual sterile fashion. Catheter insertion: non-indwelling Patient tolerance: Patient tolerated the procedure well with no immediate complications. Comments: Completed with assistance of staff   (including critical care time)  DIAGNOSTIC STUDIES: Oxygen Saturation is 97% on room air, normal by my interpretation.    COORDINATION OF CARE: 10:28 AM-Communication limited by dementia.  Will order abdomen x-ray and labs.    Labs Review Labs Reviewed  CBC WITH DIFFERENTIAL - Abnormal; Notable for the following:    Neutro Abs 7.9 (*)    All other components within normal limits  COMPREHENSIVE METABOLIC PANEL - Abnormal; Notable for the following:    Glucose, Bld 127 (*)    GFR calc non Af Amer 58 (*)    GFR calc Af Amer 68 (*)    All other components within normal limits  URINALYSIS, ROUTINE W REFLEX MICROSCOPIC    Imaging Review Dg Abd Acute W/chest  05/05/2013   CLINICAL DATA:  Abdominal pain.  EXAM: ACUTE ABDOMEN SERIES (ABDOMEN 2 VIEW & CHEST 1 VIEW)  COMPARISON:  11/23/2012  FINDINGS: Normal bowel gas pattern. No free air. There is a density that projects in the upper pelvis that is likely vascular. It is stable. Other aortoiliac vascular calcifications are noted that are also stable.  Frontal chest radiograph demonstrates an unremarkable heart, mediastinum and hila. There is stable bronchitic change in the lung bases. No acute findings in the lungs.  Bony structures are demineralized with degenerative changes along the spine. Right hip prosthesis is well aligned.  IMPRESSION: 1. No acute findings.  No obstruction or free air. 2. No acute cardiopulmonary disease.   Electronically Signed   By: Amie Portlandavid  Ormond M.D.   On: 05/05/2013 11:23   Patient had a large bowel movement while in the emergency department MDM   I personally performed the services described in this documentation,  which was scribed in my presence. The recorded information has been reviewed and is accurate.   Patient presents from her nursing facility with staff concern of abdominal pain.  On exam patient is awake, resting, seemingly comfortably.  Patient is a non-peritoneal abdomen on physical exam, though with stent insertion the patient had labs, x-ray performed here.  Patient's evaluation of his possible urinary tract infection.  X-rays unremarkable, including no suggestion of substantial stool burden. With labs suggesting urinary tract infection patient received antibiotics her.  Absent fever, distress, and otherwise reassuring labs she was discharged back to her nursing facility.   Jillian King Jaishon Krisher, MD 05/05/13 1335  Jillian King Falisha Osment, MD 05/05/13 1339  Jillian King Denard Tuminello, MD 05/05/13 [phone]

## 2013-05-22 ENCOUNTER — Emergency Department (HOSPITAL_COMMUNITY): Payer: Medicare Other

## 2013-05-22 ENCOUNTER — Emergency Department (HOSPITAL_COMMUNITY)
Admission: EM | Admit: 2013-05-22 | Discharge: 2013-05-22 | Disposition: A | Discharge status: Home / Self Care | Payer: Medicare Other | Attending: Emergency Medicine | Admitting: Emergency Medicine

## 2013-05-22 ENCOUNTER — Encounter (HOSPITAL_COMMUNITY): Payer: Self-pay | Admitting: Emergency Medicine

## 2013-05-22 DIAGNOSIS — E876 Hypokalemia: Secondary | ICD-10-CM | POA: Insufficient documentation

## 2013-05-22 DIAGNOSIS — R569 Unspecified convulsions: Secondary | ICD-10-CM | POA: Diagnosis present

## 2013-05-22 DIAGNOSIS — F3289 Other specified depressive episodes: Secondary | ICD-10-CM | POA: Insufficient documentation

## 2013-05-22 DIAGNOSIS — F0390 Unspecified dementia without behavioral disturbance: Secondary | ICD-10-CM | POA: Diagnosis not present

## 2013-05-22 DIAGNOSIS — N390 Urinary tract infection, site not specified: Secondary | ICD-10-CM | POA: Diagnosis not present

## 2013-05-22 DIAGNOSIS — R4182 Altered mental status, unspecified: Secondary | ICD-10-CM | POA: Diagnosis not present

## 2013-05-22 DIAGNOSIS — Z79899 Other long term (current) drug therapy: Secondary | ICD-10-CM | POA: Diagnosis not present

## 2013-05-22 DIAGNOSIS — Z8719 Personal history of other diseases of the digestive system: Secondary | ICD-10-CM | POA: Insufficient documentation

## 2013-05-22 DIAGNOSIS — F411 Generalized anxiety disorder: Secondary | ICD-10-CM | POA: Insufficient documentation

## 2013-05-22 DIAGNOSIS — I10 Essential (primary) hypertension: Secondary | ICD-10-CM | POA: Insufficient documentation

## 2013-05-22 DIAGNOSIS — F329 Major depressive disorder, single episode, unspecified: Secondary | ICD-10-CM | POA: Diagnosis not present

## 2013-05-22 LAB — COMPREHENSIVE METABOLIC PANEL
ALBUMIN: 3.9 g/dL (ref 3.5–5.2)
ALK PHOS: 85 U/L (ref 39–117)
ALT: 12 U/L (ref 0–35)
AST: 22 U/L (ref 0–37)
BUN: 20 mg/dL (ref 6–23)
CO2: 28 mEq/L (ref 19–32)
Calcium: 9.9 mg/dL (ref 8.4–10.5)
Chloride: 102 mEq/L (ref 96–112)
Creatinine, Ser: 0.87 mg/dL (ref 0.50–1.10)
GFR calc non Af Amer: 65 mL/min — ABNORMAL LOW (ref >90)
GFR, EST AFRICAN AMERICAN: 75 mL/min — AB (ref >90)
GLUCOSE: 126 mg/dL — AB (ref 70–99)
POTASSIUM: 4.7 meq/L (ref 3.7–5.3)
SODIUM: 141 meq/L (ref 137–147)
TOTAL PROTEIN: 7.4 g/dL (ref 6.0–8.3)
Total Bilirubin: 0.2 mg/dL — ABNORMAL LOW (ref 0.3–1.2)

## 2013-05-22 LAB — CBC WITH DIFFERENTIAL/PLATELET
BASOS PCT: 0 % (ref 0–1)
Basophils Absolute: 0 10*3/uL (ref 0.0–0.1)
Eosinophils Absolute: 0.1 10*3/uL (ref 0.0–0.7)
Eosinophils Relative: 2 % (ref 0–5)
HCT: 36.8 % (ref 36.0–46.0)
Hemoglobin: 12.3 g/dL (ref 12.0–15.0)
Lymphocytes Relative: 19 % (ref 12–46)
Lymphs Abs: 1.4 10*3/uL (ref 0.7–4.0)
MCH: 31.3 pg (ref 26.0–34.0)
MCHC: 33.4 g/dL (ref 30.0–36.0)
MCV: 93.6 fL (ref 78.0–100.0)
Monocytes Absolute: 0.4 10*3/uL (ref 0.1–1.0)
Monocytes Relative: 6 % (ref 3–12)
NEUTROS PCT: 73 % (ref 43–77)
Neutro Abs: 5.2 10*3/uL (ref 1.7–7.7)
PLATELETS: 285 10*3/uL (ref 150–400)
RBC: 3.93 MIL/uL (ref 3.87–5.11)
RDW: 12.4 % (ref 11.5–15.5)
WBC: 7.2 10*3/uL (ref 4.0–10.5)

## 2013-05-22 LAB — URINE MICROSCOPIC-ADD ON

## 2013-05-22 LAB — URINALYSIS, ROUTINE W REFLEX MICROSCOPIC
Bilirubin Urine: NEGATIVE
Glucose, UA: NEGATIVE mg/dL
Hgb urine dipstick: NEGATIVE
Ketones, ur: NEGATIVE mg/dL
NITRITE: NEGATIVE
Protein, ur: NEGATIVE mg/dL
Specific Gravity, Urine: 1.015 (ref 1.005–1.030)
UROBILINOGEN UA: 0.2 mg/dL (ref 0.0–1.0)
pH: 7 (ref 5.0–8.0)

## 2013-05-22 LAB — TROPONIN I

## 2013-05-22 MED ORDER — CEFTRIAXONE SODIUM 1 G IJ SOLR: 1.0000 g | INTRAMUSCULAR | Status: DC

## 2013-05-22 MED ORDER — CEPHALEXIN 500 MG PO CAPS: ORAL_CAPSULE | ORAL | Status: DC

## 2013-05-22 MED ORDER — DEXTROSE 5 % IV SOLN
1.0000 g | Freq: Once | INTRAVENOUS | Status: AC
  Administered 2013-05-22: 1 g via INTRAVENOUS
  Filled 2013-05-22: qty 10

## 2013-05-22 NOTE — Discharge Instructions (Signed)
Urinary Tract Infection  Urinary tract infections (UTIs) can develop anywhere along your urinary tract. Your urinary tract is your body's drainage system for removing wastes and extra water. Your urinary tract includes two kidneys, two ureters, a bladder, and a urethra. Your kidneys are a pair of bean-shaped organs. Each kidney is about the size of your fist. They are located below your ribs, one on each side of your spine.  CAUSES  Infections are caused by microbes, which are microscopic organisms, including fungi, viruses, and bacteria. These organisms are so small that they can only be seen through a microscope. Bacteria are the microbes that most commonly cause UTIs.  SYMPTOMS   Symptoms of UTIs may vary by age and gender of the patient and by the location of the infection. Symptoms in young women typically include a frequent and intense urge to urinate and a painful, burning feeling in the bladder or urethra during urination. Older women and men are more likely to be tired, shaky, and weak and have muscle aches and abdominal pain. A fever may mean the infection is in your kidneys. Other symptoms of a kidney infection include pain in your back or sides below the ribs, nausea, and vomiting.  DIAGNOSIS  To diagnose a UTI, your caregiver will ask you about your symptoms. Your caregiver also will ask to provide a urine sample. The urine sample will be tested for bacteria and white blood cells. White blood cells are made by your body to help fight infection.  TREATMENT   Typically, UTIs can be treated with medication. Because most UTIs are caused by a bacterial infection, they usually can be treated with the use of antibiotics. The choice of antibiotic and length of treatment depend on your symptoms and the type of bacteria causing your infection.  HOME CARE INSTRUCTIONS   If you were prescribed antibiotics, take them exactly as your caregiver instructs you. Finish the medication even if you feel better after you  have only taken some of the medication.   Drink enough water and fluids to keep your urine clear or pale yellow.   Avoid caffeine, tea, and carbonated beverages. They tend to irritate your bladder.   Empty your bladder often. Avoid holding urine for long periods of time.   Empty your bladder before and after sexual intercourse.   After a bowel movement, women should cleanse from front to back. Use each tissue only once.  SEEK MEDICAL CARE IF:    You have back pain.   You develop a fever.   Your symptoms do not begin to resolve within 3 days.  SEEK IMMEDIATE MEDICAL CARE IF:    You have severe back pain or lower abdominal pain.   You develop chills.   You have nausea or vomiting.   You have continued burning or discomfort with urination.  MAKE SURE YOU:    Understand these instructions.   Will watch your condition.   Will get help right away if you are not doing well or get worse.  Document Released: 10/16/2004 Document Revised: 07/08/2011 Document Reviewed: 02/14/2011  ExitCare Patient Information 2014 [employer].

## 2013-05-22 NOTE — ED Notes (Signed)
Per staff at [facility] pt had seizure activity that lasted about one minute.  Described as shaking all over and tongue hanging out.  Pt was unresponsive.  Per ems pt is back to baseline.  Pt has been seen with same before.

## 2013-05-22 NOTE — ED Notes (Signed)
[facility] called for transportation.  

## 2013-05-22 NOTE — ED Provider Notes (Signed)
CSN: [id number]     Arrival date & time 05/22/13  1049 History  This chart was scribed for American Expressathan R. Rubin PayorPickering, MD by Quintella ReichertMatthew Underwood, ED scribe.  This patient was seen in room APA18/APA18 and the patient's care was started at 11:22 AM.   Chief Complaint  Patient presents with  . Seizures    The history is provided by the patient. No language interpreter was used.    Level 5 Caveat: Dementia  HPI Comments: Jillian King is a 73 y.o. female brought in via EMS from nursing facility to the Emergency Department for a witnessed seizure lasting approximately 1 minute.  Per triage note, staff described seizure as "shaking all over and tongue hanging out" and unresopnsiveness.  She has since returned to her baseline.  Pt is unable to provide any history as she has h/o Alzheimer's and is incoherent at baseline.   Past Medical History  Diagnosis Date  . Dementia   . GERD (gastroesophageal reflux disease)   . Hypokalemia   . Hip fracture   . Anxiety   . Depression   . Hypertension     History reviewed. No pertinent past surgical history.  History reviewed. No pertinent family history.   History  Substance Use Topics  . Smoking status: Never Smoker   . Smokeless tobacco: Not on file  . Alcohol Use: No    OB History   Grav Para Term Preterm Abortions TAB SAB Ect Mult Living                   Review of Systems  Unable to perform ROS: Dementia      Allergies  Review of patient's allergies indicates no known allergies.  Home Medications   Prior to Admission medications   Medication Sig Start Date End Date Taking? Authorizing Provider  acetaminophen (TYLENOL) 650 MG CR tablet Take 650 mg by mouth 2 (two) times daily.     Historical Provider, MD  aspirin 81 MG chewable tablet Chew 81 mg by mouth daily.      Historical Provider, MD  busPIRone (BUSPAR) 5 MG tablet Take 5 mg by mouth 2 (two) times daily.     Historical Provider, MD  Calcium Carb-Cholecalciferol (CALCIUM 600  + D PO) Take 1 tablet by mouth 3 (three) times daily.    Historical Provider, MD  magnesium hydroxide (MILK OF MAGNESIA) 400 MG/5ML suspension Take 30 mLs by mouth 2 (two) times daily as needed for mild constipation.    Historical Provider, MD  Multiple Vitamin (MULTIVITAMIN) LIQD Take 5 mLs by mouth daily.    Historical Provider, MD  Nutritional Supplements (NUTRITIONAL SHAKE PO) Take 4 oz by mouth 4 (four) times daily.    Historical Provider, MD  polyethylene glycol (MIRALAX / GLYCOLAX) packet Take 17 g by mouth at bedtime.    Historical Provider, MD  rivastigmine (EXELON) 3 MG capsule Take 3 mg by mouth 2 (two) times daily.      Historical Provider, MD  traZODone (DESYREL) 100 MG tablet Take 100 mg by mouth at bedtime.    Historical Provider, MD  zinc oxide 20 % ointment Apply 1 application topically every 2 (two) hours. With toileting    Historical Provider, MD   BP 111/73  Pulse 72  Temp(Src) 97.5 F (36.4 C) (Rectal)  Resp 18  SpO2 98%  Physical Exam  Nursing note and vitals reviewed. Constitutional: She appears well-developed. No distress.  HENT:  Head: Normocephalic and atraumatic.  No signs of  trauma on head or neck  Eyes: EOM are normal. Pupils are equal, round, and reactive to light.  Neck: Neck supple. No tracheal deviation present.  Neck nontender  Cardiovascular: Normal rate.   Pulmonary/Chest: Effort normal. No respiratory distress. She has no wheezes. She has no rhonchi. She has no rales.  Abdominal: There is no tenderness.  Musculoskeletal: Normal range of motion.  Appears to move all extremities although does not follow commands.  No evidence of trauma to extremities.  Neurological:  Face symmetric  Skin: Skin is warm and dry.    ED Course  Procedures (including critical care time)  COORDINATION OF CARE: 11:25 AM-Communication limited by dementia.  Will order EKG, CXR, head CT, and labs.     Labs Review Labs Reviewed  COMPREHENSIVE METABOLIC PANEL -  Abnormal; Notable for the following:    Glucose, Bld 126 (*)    Total Bilirubin 0.2 (*)    GFR calc non Af Amer 65 (*)    GFR calc Af Amer 75 (*)    All other components within normal limits  URINALYSIS, ROUTINE W REFLEX MICROSCOPIC - Abnormal; Notable for the following:    APPearance HAZY (*)    Leukocytes, UA TRACE (*)    All other components within normal limits  URINE MICROSCOPIC-ADD ON - Abnormal; Notable for the following:    Bacteria, UA MANY (*)    All other components within normal limits  URINE CULTURE  CBC WITH DIFFERENTIAL  TROPONIN I    Imaging Review Dg Chest 1 View  05/22/2013   CLINICAL DATA:  Seizure.  EXAM: CHEST - 1 VIEW  COMPARISON:  None.  FINDINGS: Mediastinum hilar structures are normal. Heart size normal. Normal pulmonary vascularity. Possible COPD. No evidence of pleural effusion or pneumothorax. No acute bony abnormality identified.  IMPRESSION: 1. No acute abnormality. 2. Possible COPD.   Electronically Signed   By: Maisie Fushomas  Register   On: 05/22/2013 13:23   Ct Head Wo Contrast  05/22/2013   CLINICAL DATA:  Seizure.  Dementia  EXAM: CT HEAD WITHOUT CONTRAST  TECHNIQUE: Contiguous axial images were obtained from the base of the skull through the vertex without intravenous contrast.  COMPARISON:  11/23/2012  FINDINGS: Exam detail is diminished by a motion artifact. There is diffuse low attenuation within the subcortical and periventricular white matter compatible with chronic small vessel ischemic disease. Prominence of the sulci and ventricles are identified consistent with brain atrophy. There is no evidence for acute intracranial hemorrhage, cortical infarct or mass. No abnormal extra-axial fluid collections identified.  IMPRESSION: 1. No acute intracranial abnormalities. 2. Small vessel ischemic disease and brain atrophy.   Electronically Signed   By: Signa Kellaylor  Stroud M.D.   On: 05/22/2013 13:22     EKG Interpretation None      MDM   Final diagnoses:  UTI  (urinary tract infection)    Patient with altered mental status. Question of seizure. She is back at her baseline now. No evidence of trauma. No evidence of tongue biting. No fever. White count is not elevated. She does have a urinary tract infection. She's had recent urinary tract infections and was treated with Fosphomycin. Will treat with Rocephin here and may be worth Rocephin at the nursing home. Nursing staff we'll see if this is possible.    I personally performed the services described in this documentation, which was scribed in my presence. The recorded information has been reviewed and is accurate.     Juliet RudeNathan R. Rubin PayorPickering, MD  05/22/13 1548 

## 2013-05-23 LAB — URINE CULTURE
Colony Count: NO GROWTH
Culture: NO GROWTH

## 2013-07-24 ENCOUNTER — Emergency Department (HOSPITAL_COMMUNITY)
Admission: EM | Admit: 2013-07-24 | Discharge: 2013-07-25 | Disposition: A | Discharge status: Home / Self Care | Payer: Medicare Other | Attending: Emergency Medicine | Admitting: Emergency Medicine

## 2013-07-24 ENCOUNTER — Encounter (HOSPITAL_COMMUNITY): Payer: Self-pay | Admitting: Emergency Medicine

## 2013-07-24 DIAGNOSIS — Z792 Long term (current) use of antibiotics: Secondary | ICD-10-CM | POA: Diagnosis not present

## 2013-07-24 DIAGNOSIS — N390 Urinary tract infection, site not specified: Secondary | ICD-10-CM | POA: Insufficient documentation

## 2013-07-24 DIAGNOSIS — R55 Syncope and collapse: Secondary | ICD-10-CM | POA: Diagnosis present

## 2013-07-24 DIAGNOSIS — Z8781 Personal history of (healed) traumatic fracture: Secondary | ICD-10-CM | POA: Diagnosis not present

## 2013-07-24 DIAGNOSIS — F3289 Other specified depressive episodes: Secondary | ICD-10-CM | POA: Insufficient documentation

## 2013-07-24 DIAGNOSIS — Z7982 Long term (current) use of aspirin: Secondary | ICD-10-CM | POA: Insufficient documentation

## 2013-07-24 DIAGNOSIS — Z79899 Other long term (current) drug therapy: Secondary | ICD-10-CM | POA: Diagnosis not present

## 2013-07-24 DIAGNOSIS — K219 Gastro-esophageal reflux disease without esophagitis: Secondary | ICD-10-CM | POA: Diagnosis not present

## 2013-07-24 DIAGNOSIS — F329 Major depressive disorder, single episode, unspecified: Secondary | ICD-10-CM | POA: Insufficient documentation

## 2013-07-24 DIAGNOSIS — I10 Essential (primary) hypertension: Secondary | ICD-10-CM | POA: Insufficient documentation

## 2013-07-24 DIAGNOSIS — F0390 Unspecified dementia without behavioral disturbance: Secondary | ICD-10-CM | POA: Insufficient documentation

## 2013-07-24 DIAGNOSIS — F411 Generalized anxiety disorder: Secondary | ICD-10-CM | POA: Insufficient documentation

## 2013-07-24 NOTE — ED Notes (Signed)
Pt from [facility] [place]. Staff reports pt on toilet defecating tonight and noticed that pt's eyes rolled back of head and started "shaking". Staff removed pt from toilet and laid her down on the bathroom floor and called EMS. Staff thought she had a seizure, though pt has no history of such and no seizure activity was noted by EMS. Pt has dementia and is presently at base line. Pt is full DNR status with DNR certificate at bedside.

## 2013-07-25 DIAGNOSIS — R55 Syncope and collapse: Secondary | ICD-10-CM | POA: Diagnosis not present

## 2013-07-25 LAB — BASIC METABOLIC PANEL
Anion gap: 16 — ABNORMAL HIGH (ref 5–15)
BUN: 24 mg/dL — ABNORMAL HIGH (ref 6–23)
CO2: 23 mEq/L (ref 19–32)
Calcium: 10.2 mg/dL (ref 8.4–10.5)
Chloride: 99 mEq/L (ref 96–112)
Creatinine, Ser: 0.9 mg/dL (ref 0.50–1.10)
GFR calc Af Amer: 72 mL/min — ABNORMAL LOW (ref >90)
GFR calc non Af Amer: 62 mL/min — ABNORMAL LOW (ref >90)
Glucose, Bld: 109 mg/dL — ABNORMAL HIGH (ref 70–99)
Potassium: 4.4 mEq/L (ref 3.7–5.3)
SODIUM: 138 meq/L (ref 137–147)

## 2013-07-25 LAB — CBC WITH DIFFERENTIAL/PLATELET
Basophils Absolute: 0 10*3/uL (ref 0.0–0.1)
Basophils Relative: 0 % (ref 0–1)
EOS PCT: 2 % (ref 0–5)
Eosinophils Absolute: 0.2 10*3/uL (ref 0.0–0.7)
HCT: 38.4 % (ref 36.0–46.0)
HEMOGLOBIN: 12.6 g/dL (ref 12.0–15.0)
LYMPHS ABS: 1.8 10*3/uL (ref 0.7–4.0)
Lymphocytes Relative: 18 % (ref 12–46)
MCH: 30.7 pg (ref 26.0–34.0)
MCHC: 32.8 g/dL (ref 30.0–36.0)
MCV: 93.4 fL (ref 78.0–100.0)
MONOS PCT: 7 % (ref 3–12)
Monocytes Absolute: 0.7 10*3/uL (ref 0.1–1.0)
Neutro Abs: 7.3 10*3/uL (ref 1.7–7.7)
Neutrophils Relative %: 73 % (ref 43–77)
PLATELETS: 211 10*3/uL (ref 150–400)
RBC: 4.11 MIL/uL (ref 3.87–5.11)
RDW: 12.5 % (ref 11.5–15.5)
WBC: 10.1 10*3/uL (ref 4.0–10.5)

## 2013-07-25 LAB — URINALYSIS, ROUTINE W REFLEX MICROSCOPIC
Bilirubin Urine: NEGATIVE
Glucose, UA: NEGATIVE mg/dL
Ketones, ur: NEGATIVE mg/dL
NITRITE: NEGATIVE
PH: 6.5 (ref 5.0–8.0)
Protein, ur: 30 mg/dL — AB
Specific Gravity, Urine: 1.021 (ref 1.005–1.030)
Urobilinogen, UA: 0.2 mg/dL (ref 0.0–1.0)

## 2013-07-25 LAB — URINE MICROSCOPIC-ADD ON

## 2013-07-25 LAB — TROPONIN I

## 2013-07-25 MED ORDER — CEPHALEXIN 250 MG PO CAPS
500.0000 mg | ORAL_CAPSULE | Freq: Once | ORAL | Status: AC
  Administered 2013-07-25: 500 mg via ORAL
  Filled 2013-07-25: qty 2

## 2013-07-25 MED ORDER — CEPHALEXIN 500 MG PO CAPS: 500.0000 mg | ORAL_CAPSULE | Freq: Two times a day (BID) | ORAL | Status: DC

## 2013-07-25 NOTE — ED Provider Notes (Signed)
CSN: [id number]     Arrival date & time 07/24/13  2334 History   First MD Initiated Contact with Patient 07/24/13 2359     Chief Complaint  Patient presents with  . Near Syncope     (Consider location/radiation/quality/duration/timing/severity/associated sxs/prior Treatment) HPI Comments: LEVEL 5 CAVEAT FOR DEMENTIA Pt comes in via EMS for near syncope. Hx of dementia, depression , HTN. Pt resides at a nursing home, and staff reported that they were helping patient with defecation when her eyes rolled back and she became unresponsive and started shaking. No incontinence. No falls/trauma. No known cardiac hx or seizure hx.  Patient is a 73 y.o. female presenting with near-syncope. The history is provided by the EMS personnel.  Near Syncope    Past Medical History  Diagnosis Date  . Dementia   . GERD (gastroesophageal reflux disease)   . Hypokalemia   . Hip fracture   . Anxiety   . Depression   . Hypertension    History reviewed. No pertinent past surgical history. No family history on file. History  Substance Use Topics  . Smoking status: Never Smoker   . Smokeless tobacco: Not on file  . Alcohol Use: No   OB History   Grav Para Term Preterm Abortions TAB SAB Ect Mult Living                 Review of Systems  Unable to perform ROS: Dementia  Cardiovascular: Positive for near-syncope.      Allergies  Review of patient's allergies indicates no known allergies.  Home Medications   Prior to Admission medications   Medication Sig Start Date End Date Taking? Authorizing Provider  acetaminophen (TYLENOL) 650 MG CR tablet Take 650 mg by mouth 2 (two) times daily.    Yes Historical Provider, MD  aspirin 81 MG chewable tablet Chew 81 mg by mouth daily.     Yes Historical Provider, MD  busPIRone (BUSPAR) 5 MG tablet Take 2.5 mg by mouth 2 (two) times daily.    Yes Historical Provider, MD  Calcium Carb-Cholecalciferol (CALCIUM 600 + D PO) Take 1 tablet by mouth 3 (three)  times daily.   Yes Historical Provider, MD  lactulose (CHRONULAC) 10 GM/15ML solution Take 30 g by mouth every other day.   Yes Historical Provider, MD  Multiple Vitamin (MULTIVITAMIN) LIQD Take 5 mLs by mouth daily.   Yes Historical Provider, MD  polyethylene glycol (MIRALAX / GLYCOLAX) packet Take 17 g by mouth at bedtime.   Yes Historical Provider, MD  rivastigmine (EXELON) 3 MG capsule Take 3 mg by mouth 2 (two) times daily.     Yes Historical Provider, MD  traZODone (DESYREL) 100 MG tablet Take 100 mg by mouth at bedtime.   Yes Historical Provider, MD  zinc oxide 20 % ointment Apply 1 application topically every 2 (two) hours. With toileting   Yes Historical Provider, MD  cephALEXin (KEFLEX) 500 MG capsule Take 1 capsule (500 mg total) by mouth 2 (two) times daily. 07/25/13   Bryer Gottsch, MD   BP 111/53  Pulse 74  Temp(Src) 97.8 F (36.6 C) (Oral)  Resp 13  SpO2 98% Physical Exam  Nursing note and vitals reviewed. Constitutional: She is oriented to person, place, and time. She appears well-developed and well-nourished.  HENT:  Head: Normocephalic and atraumatic.  Eyes: EOM are normal. Pupils are equal, round, and reactive to light.  Neck: Neck supple.  Cardiovascular: Normal rate, regular rhythm and normal heart sounds.   Pulmonary/Chest:  Effort normal. No respiratory distress.  Abdominal: Soft. She exhibits no distension. There is no tenderness. There is no rebound and no guarding.  Musculoskeletal: She exhibits no edema.  Neurological: She is alert and oriented to person, place, and time.  Skin: Skin is warm and dry.    ED Course  Procedures (including critical care time) Labs Review Labs Reviewed  BASIC METABOLIC PANEL - Abnormal; Notable for the following:    Glucose, Bld 109 (*)    BUN 24 (*)    GFR calc non Af Amer 62 (*)    GFR calc Af Amer 72 (*)    Anion gap 16 (*)    All other components within normal limits  URINALYSIS, ROUTINE W REFLEX MICROSCOPIC -  Abnormal; Notable for the following:    APPearance CLOUDY (*)    Hgb urine dipstick MODERATE (*)    Protein, ur 30 (*)    Leukocytes, UA LARGE (*)    All other components within normal limits  URINE MICROSCOPIC-ADD ON - Abnormal; Notable for the following:    Bacteria, UA FEW (*)    All other components within normal limits  URINE CULTURE  CBC WITH DIFFERENTIAL  TROPONIN I    Imaging Review No results found.   EKG Interpretation   Date/Time:  Monday July 25 2013 00:21:19 EDT Ventricular Rate:  74 PR Interval:  66 QRS Duration: 107 QT Interval:  446 QTC Calculation: 495 R Axis:   69 Text Interpretation:  Sinus rhythm Short PR interval Probable anteroseptal  infarct, old Baseline wander in lead(s) V3 Artifact , but no acute acute  changes appreciated Confirmed by Rhunette CroftNANAVATI, MD, Janey GentaANKIT [phone]) on 07/25/2013  7:41:13 AM      Date: 07/25/2013  Rate: 74  Rhythm: normal sinus rhythm  QRS Axis: normal  Intervals: normal  ST/T Wave abnormalities: nonspecific ST/T changes  Conduction Disutrbances:none  Narrative Interpretation:   Old EKG Reviewed: unchanged   MDM   Final diagnoses:  Near syncope  UTI (lower urinary tract infection)    DDx includes: Orthostatic hypotension Stroke Vertebral artery dissection/stenosis Dysrhythmia PE Vasovagal/neurocardiogenic syncope Aortic stenosis Valvular disorder/Cardiomyopathy Anemia   Pt present with near syncope. She has no cardiac disease, and her cardiac exam is benign. Pt monitored for extended period of time on telemetry, and we had no complications - thus she was discharged back. Labs WNL.  Derwood KaplanAnkit Maleyah Evans, MD 07/25/13 [phone]

## 2013-07-25 NOTE — ED Notes (Signed)
Secretary to call [facility] for transport back to [facility].

## 2013-07-25 NOTE — ED Notes (Addendum)
Please call Misty StanleyLisa [phone] at [facility] when pt is discharged.

## 2013-07-25 NOTE — Discharge Instructions (Signed)
We saw Jillian King for near fainting. Workup in the ER and constant cardiac monitoring is normal. We recommend that she get workup done for syncope as an outpatient as per the doctor at the centers orders. Return to the ER if the episode repeats.   Near-Syncope Near-syncope (commonly known as near fainting) is sudden weakness, dizziness, or feeling like you might pass out. During an episode of near-syncope, you may also develop pale skin, have tunnel vision, or feel sick to your stomach (nauseous). Near-syncope may occur when getting up after sitting or while standing for a long time. It is caused by a sudden decrease in blood flow to the brain. This decrease can result from various causes or triggers, most of which are not serious. However, because near-syncope can sometimes be a sign of something serious, a medical evaluation is required. The specific cause is often not determined. HOME CARE INSTRUCTIONS  Monitor your condition for any changes. The following actions may help to alleviate any discomfort you are experiencing:  Have someone stay with you until you feel stable.  Lie down right away and prop your feet up if you start feeling like you might faint. Breathe deeply and steadily. Wait until all the symptoms have passed. Most of these episodes last only a few minutes. You may feel tired for several hours.   Drink enough fluids to keep your urine clear or pale yellow.   If you are taking blood pressure or heart medicine, get up slowly when seated or lying down. Take several minutes to sit and then stand. This can reduce dizziness.  Follow up with your health care provider as directed. SEEK IMMEDIATE MEDICAL CARE IF:   You have a severe headache.   You have unusual pain in the chest, abdomen, or back.   You are bleeding from the mouth or rectum, or you have black or tarry stool.   You have an irregular or very fast heartbeat.   You have repeated fainting or have seizure-like  jerking during an episode.   You faint when sitting or lying down.   You have confusion.   You have difficulty walking.   You have severe weakness.   You have vision problems.  MAKE SURE YOU:   Understand these instructions.  Will watch your condition.  Will get help right away if you are not doing well or get worse. Document Released: 01/06/2005 Document Revised: 01/11/2013 Document Reviewed: 06/11/2012 [facility] Patient Information 2015 [employer] [place] [place]. This information is not intended to replace advice given to you by your health care provider. Make sure you discuss any questions you have with your health care provider.

## 2013-07-25 NOTE — ED Notes (Signed)
Report given to [facility] transport prior to discharge.  Pt was alert and disoriented x4 per her normal prior to this ED visit.  Pt sent [facility].

## 2013-07-26 ENCOUNTER — Encounter (HOSPITAL_COMMUNITY): Payer: Self-pay | Admitting: Emergency Medicine

## 2013-07-26 ENCOUNTER — Emergency Department (HOSPITAL_COMMUNITY)
Admission: EM | Admit: 2013-07-26 | Discharge: 2013-07-26 | Disposition: A | Discharge status: Home / Self Care | Payer: Medicare Other | Attending: Emergency Medicine | Admitting: Emergency Medicine

## 2013-07-26 DIAGNOSIS — F0390 Unspecified dementia without behavioral disturbance: Secondary | ICD-10-CM | POA: Insufficient documentation

## 2013-07-26 DIAGNOSIS — F329 Major depressive disorder, single episode, unspecified: Secondary | ICD-10-CM | POA: Insufficient documentation

## 2013-07-26 DIAGNOSIS — Z79899 Other long term (current) drug therapy: Secondary | ICD-10-CM | POA: Insufficient documentation

## 2013-07-26 DIAGNOSIS — K219 Gastro-esophageal reflux disease without esophagitis: Secondary | ICD-10-CM | POA: Diagnosis not present

## 2013-07-26 DIAGNOSIS — N390 Urinary tract infection, site not specified: Secondary | ICD-10-CM | POA: Diagnosis not present

## 2013-07-26 DIAGNOSIS — F411 Generalized anxiety disorder: Secondary | ICD-10-CM | POA: Diagnosis not present

## 2013-07-26 DIAGNOSIS — Z792 Long term (current) use of antibiotics: Secondary | ICD-10-CM | POA: Insufficient documentation

## 2013-07-26 DIAGNOSIS — Z7982 Long term (current) use of aspirin: Secondary | ICD-10-CM | POA: Insufficient documentation

## 2013-07-26 DIAGNOSIS — Z8781 Personal history of (healed) traumatic fracture: Secondary | ICD-10-CM | POA: Insufficient documentation

## 2013-07-26 DIAGNOSIS — F3289 Other specified depressive episodes: Secondary | ICD-10-CM | POA: Diagnosis not present

## 2013-07-26 DIAGNOSIS — I10 Essential (primary) hypertension: Secondary | ICD-10-CM | POA: Diagnosis not present

## 2013-07-26 DIAGNOSIS — K5900 Constipation, unspecified: Secondary | ICD-10-CM | POA: Diagnosis present

## 2013-07-26 NOTE — Discharge Instructions (Signed)
The urine culture is still pending at this time. It showed gram-negative rods but does not have susceptibilities yet. She does not appear to have any shortness of breath at this time. Continue her antibiotics. If constipation continues may need increase in lactulose frequency.

## 2013-07-26 NOTE — ED Provider Notes (Signed)
CSN: [id number]     Arrival date & time 07/26/13  1216 History  This chart was scribed for American Expressathan R. Rubin PayorPickering, MD,  by Ashley JacobsBrittany Andrews, ED Scribe. The patient was seen in room APA14/APA14 and the patient's care was started at 1:15 PM.    First MD Initiated Contact with Patient 07/26/13 1255     Chief Complaint  Patient presents with  . Constipation     (Consider location/radiation/quality/duration/timing/severity/associated sxs/prior Treatment) Patient is a 73 y.o. female presenting with constipation. The history is provided by the nursing home and medical records. The history is limited by the absence of a caregiver and the condition of the patient. No language interpreter was used.  Constipation HPI HPI Comments:LEVEL 5 CAVEAT: DEMENTIA Colby Jeronimo GreavesJ Sickman is a 73 y.o. female who presents to the Emergency Department  Via EMS on concerns about possible constipation and SOB. When EMS arrived pt was slumped over and but easily aroused. She was treated for a UTI 7/5.   Past Medical History  Diagnosis Date  . Dementia   . GERD (gastroesophageal reflux disease)   . Hypokalemia   . Hip fracture   . Anxiety   . Depression   . Hypertension    History reviewed. No pertinent past surgical history. History reviewed. No pertinent family history. History  Substance Use Topics  . Smoking status: Never Smoker   . Smokeless tobacco: Not on file  . Alcohol Use: No   OB History   Grav Para Term Preterm Abortions TAB SAB Ect Mult Living                 Review of Systems  Unable to perform ROS: Dementia  Gastrointestinal: Positive for constipation.      Allergies  Review of patient's allergies indicates no known allergies.  Home Medications   Prior to Admission medications   Medication Sig Start Date End Date Taking? Authorizing Provider  acetaminophen (TYLENOL) 650 MG CR tablet Take 650 mg by mouth 2 (two) times daily.     Historical Provider, MD  aspirin 81 MG chewable tablet  Chew 81 mg by mouth daily.      Historical Provider, MD  busPIRone (BUSPAR) 5 MG tablet Take 2.5 mg by mouth 2 (two) times daily.     Historical Provider, MD  Calcium Carb-Cholecalciferol (CALCIUM 600 + D PO) Take 1 tablet by mouth 3 (three) times daily.    Historical Provider, MD  cephALEXin (KEFLEX) 500 MG capsule Take 1 capsule (500 mg total) by mouth 2 (two) times daily. 07/25/13   Derwood KaplanAnkit Nanavati, MD  lactulose (CHRONULAC) 10 GM/15ML solution Take 30 g by mouth every other day.    Historical Provider, MD  Multiple Vitamin (MULTIVITAMIN) LIQD Take 5 mLs by mouth daily.    Historical Provider, MD  polyethylene glycol (MIRALAX / GLYCOLAX) packet Take 17 g by mouth at bedtime.    Historical Provider, MD  rivastigmine (EXELON) 3 MG capsule Take 3 mg by mouth 2 (two) times daily.      Historical Provider, MD  traZODone (DESYREL) 100 MG tablet Take 100 mg by mouth at bedtime.    Historical Provider, MD  zinc oxide 20 % ointment Apply 1 application topically every 2 (two) hours. With toileting    Historical Provider, MD   BP 112/72  Pulse 66  Temp(Src) 98.1 F (36.7 C) (Oral)  Resp 18  SpO2 100% Physical Exam  Nursing note and vitals reviewed. Constitutional: She appears well-developed and well-nourished. No distress.  Pt is awake and pleasant. She is demented.   HENT:  Head: Normocephalic and atraumatic.  Eyes: Conjunctivae and EOM are normal.  Neck: Neck supple. No tracheal deviation present.  Cardiovascular: Normal rate.   Pulmonary/Chest: Effort normal. No respiratory distress.  Abdominal: She exhibits no distension. There is tenderness (mild). There is no rebound and no guarding.  Mild lower abdominal tenderness rebound or guarding  Musculoskeletal: Normal range of motion.  Neurological: She is alert.  Patient is demented. Will not follow most commands. She is awake and appears pleasant.  Skin: Skin is warm and dry.  Psychiatric: She has a normal mood and affect. Her behavior is  normal.    ED Course  Procedures (including critical care time) DIAGNOSTIC STUDIES: Oxygen Saturation is 100% on room air , normal by my interpretation.      Labs Review Labs Reviewed - No data to display  Imaging Review No results found.   EKG Interpretation None      MDM   Final diagnoses:  None    Patient brought in for possible shortness of breath. Per EMS was reportedly slumped over and then quickly aroused. EMS please may have been sleeping. Lungs are clear patient is not hypoxic. Also had reports of possible constipation. Minimal abdominal tenderness. Patient started on lactulose and dose  or frequency made to be increased. He is urine tract infection from 2 days ago. Urine cultures been reviewed and showed gram-negative rods, however there is no susceptibility. Will discharge home  I personally performed the services described in this documentation, which was scribed in my presence. The recorded information has been reviewed and is accurate.    Juliet RudeNathan R. Rubin PayorPickering, MD 07/26/13 1336

## 2013-07-26 NOTE — ED Notes (Addendum)
EMS called out for SOB,when EMS arrived pt was slumped over in wheelchair.  EMS states pt easily arroused. ? Asleep per EMS>  pt is in no respiratory distress per ems.  Pt seen the other day for a UTI.  ? Constipation per nursing staff.

## 2013-07-27 LAB — URINE CULTURE: Colony Count: 70000

## 2013-07-28 NOTE — Progress Notes (Signed)
ED Antimicrobial Stewardship Positive Culture Follow Up   Jillian King is an 73 y.o. female who [facility] with a chief complaint of  Chief Complaint  Patient presents with  . Near Syncope    Recent Results (from the past 720 hour(s))  URINE CULTURE     Status: None   Collection Time    07/25/13  1:31 AM      Result Value Ref Range Status   Specimen Description URINE, CATHETERIZED   Final   Special Requests ADDED [id number]   Final   Culture  Setup Time     Final   Value: 07/25/2013 03:57     Performed at [facility]   Colony Count     Final   Value: 70,000 COLONIES/ML     Performed at [facility]   Culture     Final   Value: PSEUDOMONAS AERUGINOSA     Performed at [facility]   Report Status 07/27/2013 FINAL   Final   Organism ID, Bacteria PSEUDOMONAS AERUGINOSA   Final    [x]  Treated with cephalexin, organism resistant to prescribed antimicrobial []  Patient discharged originally without antimicrobial agent and treatment is now indicated  New antibiotic prescription: cipro 250mg  PO BID x 3 days.  [facility]commend[facility]  ED Provider: Allen DerryMercedes Camprubi-Soms PA-C   Mickeal SkinnerFrens, Mayme Profeta John 07/28/2013, 9:13 AM Infectious Diseases Pharmacist Phone# [phone]

## 2013-07-29 ENCOUNTER — Telehealth (HOSPITAL_BASED_OUTPATIENT_CLINIC_OR_DEPARTMENT_OTHER): Payer: Self-pay | Admitting: Emergency Medicine

## 2013-07-29 NOTE — Telephone Encounter (Signed)
Post ED Visit - Positive Culture Follow-up: Successful Patient Follow-Up  Culture assessed and recommendations reviewed by: []  Wes Dulaney, Pharm.D., BCPS [x]  Celedonio MiyamotoJeremy Frens, Pharm.D., BCPS []  Georgina PillionElizabeth Martin, Pharm.D., BCPS []  Mud BayMinh Pham, VermontPharm.D., BCPS, AAHIVP []  Estella HuskMichelle Turner, Pharm.D., BCPS, AAHIVP  Positive urine culture  []  Patient discharged without antimicrobial prescription and treatment is now indicated [x]  Organism is resistant to prescribed ED discharge antimicrobial []  Patient with positive blood cultures  Changes discussed with ED provider: [place] RavensMercedes Camprubi-Soms PA-C New antibiotic prescription: Cipro 250 mg PO BID x 3 days; [facility]    [place], [place] 07/29/2013, 12:59 PM

## 2013-08-06 ENCOUNTER — Telehealth (HOSPITAL_BASED_OUTPATIENT_CLINIC_OR_DEPARTMENT_OTHER): Payer: Self-pay

## 2013-08-06 NOTE — Telephone Encounter (Signed)
[facility] fax[facility] [phone] Attn Olegario MessierKathy

## 2013-11-14 ENCOUNTER — Emergency Department (HOSPITAL_COMMUNITY)
Admission: EM | Admit: 2013-11-14 | Discharge: 2013-11-15 | Disposition: A | Discharge status: Home / Self Care | Payer: Medicare Other | Attending: Emergency Medicine | Admitting: Emergency Medicine

## 2013-11-14 ENCOUNTER — Encounter (HOSPITAL_COMMUNITY): Payer: Self-pay | Admitting: Emergency Medicine

## 2013-11-14 ENCOUNTER — Emergency Department (HOSPITAL_COMMUNITY): Payer: Medicare Other

## 2013-11-14 DIAGNOSIS — S0101XA Laceration without foreign body of scalp, initial encounter: Secondary | ICD-10-CM | POA: Diagnosis not present

## 2013-11-14 DIAGNOSIS — K219 Gastro-esophageal reflux disease without esophagitis: Secondary | ICD-10-CM | POA: Diagnosis not present

## 2013-11-14 DIAGNOSIS — W06XXXA Fall from bed, initial encounter: Secondary | ICD-10-CM | POA: Diagnosis not present

## 2013-11-14 DIAGNOSIS — Z7982 Long term (current) use of aspirin: Secondary | ICD-10-CM | POA: Insufficient documentation

## 2013-11-14 DIAGNOSIS — Z8781 Personal history of (healed) traumatic fracture: Secondary | ICD-10-CM | POA: Insufficient documentation

## 2013-11-14 DIAGNOSIS — F329 Major depressive disorder, single episode, unspecified: Secondary | ICD-10-CM | POA: Diagnosis not present

## 2013-11-14 DIAGNOSIS — F419 Anxiety disorder, unspecified: Secondary | ICD-10-CM | POA: Diagnosis not present

## 2013-11-14 DIAGNOSIS — F0390 Unspecified dementia without behavioral disturbance: Secondary | ICD-10-CM | POA: Diagnosis not present

## 2013-11-14 DIAGNOSIS — Z8639 Personal history of other endocrine, nutritional and metabolic disease: Secondary | ICD-10-CM | POA: Diagnosis not present

## 2013-11-14 DIAGNOSIS — Y9389 Activity, other specified: Secondary | ICD-10-CM | POA: Insufficient documentation

## 2013-11-14 DIAGNOSIS — Y92128 Other place in nursing home as the place of occurrence of the external cause: Secondary | ICD-10-CM | POA: Diagnosis not present

## 2013-11-14 DIAGNOSIS — S0990XA Unspecified injury of head, initial encounter: Secondary | ICD-10-CM | POA: Diagnosis present

## 2013-11-14 DIAGNOSIS — I10 Essential (primary) hypertension: Secondary | ICD-10-CM | POA: Insufficient documentation

## 2013-11-14 DIAGNOSIS — Z79899 Other long term (current) drug therapy: Secondary | ICD-10-CM | POA: Diagnosis not present

## 2013-11-14 DIAGNOSIS — W19XXXA Unspecified fall, initial encounter: Secondary | ICD-10-CM

## 2013-11-14 DIAGNOSIS — S0191XA Laceration without foreign body of unspecified part of head, initial encounter: Secondary | ICD-10-CM

## 2013-11-14 NOTE — ED Notes (Signed)
[facility], reportedly she fell out of her bed and hit her head possibly on a nightstand.  Pt has a laceration to the left scalp, bleeding controlled, no apparent loc, pt is awake, alert, per her normal mental status

## 2013-11-14 NOTE — ED Provider Notes (Signed)
CSN: [id number]     Arrival date & time 11/14/13  2240 History   First MD Initiated Contact with Patient 11/14/13 2257     Chief Complaint  Patient presents with  . Fall  . Head Injury     (Consider location/radiation/quality/duration/timing/severity/associated sxs/prior Treatment) HPI 73 year old female with dementia apparently fell out of bed found in the floor with a laceration on her occipital scalp with bleeding controlled with local pressure, apparently unwitnessed, patient is awake alert confused and does not follow commands andspeech is unintelligible which apparently is baseline for the patient. Patient moves all 4 extremities spontaneously but does not cooperate to try to walk. Further history of present illness and review of systems unobtainable due to patient's dementia.  Tetanus shot up-to-date in 2014 according to nursing staff. Past Medical History  Diagnosis Date  . Dementia   . GERD (gastroesophageal reflux disease)   . Hypokalemia   . Hip fracture   . Anxiety   . Depression   . Hypertension    History reviewed. No pertinent past surgical history. No family history on file. History  Substance Use Topics  . Smoking status: Never Smoker   . Smokeless tobacco: Not on file  . Alcohol Use: No   OB History    No data available     Review of Systems  Unable to perform ROS: Dementia      Allergies  Review of patient's allergies indicates no known allergies.  Home Medications   Prior to Admission medications   Medication Sig Start Date End Date Taking? Authorizing Provider  acetaminophen (TYLENOL) 650 MG CR tablet Take 650 mg by mouth 2 (two) times daily.    Yes Historical Provider, MD  aspirin 81 MG chewable tablet Chew 81 mg by mouth daily.     Yes Historical Provider, MD  Calcium Carb-Cholecalciferol (CALCIUM 600 + D PO) Take 1 tablet by mouth 3 (three) times daily.   Yes Historical Provider, MD  lactulose (CHRONULAC) 10 GM/15ML solution Take 30 g by  mouth every other day.   Yes Historical Provider, MD  Multiple Vitamin (MULTIVITAMIN) LIQD Take 5 mLs by mouth daily.   Yes Historical Provider, MD  polyethylene glycol (MIRALAX / GLYCOLAX) packet Take 17 g by mouth at bedtime.   Yes Historical Provider, MD  rivastigmine (EXELON) 3 MG capsule Take 3 mg by mouth 2 (two) times daily.     Yes Historical Provider, MD  traZODone (DESYREL) 50 MG tablet Take 50 mg by mouth at bedtime.   Yes Historical Provider, MD  zinc oxide 20 % ointment Apply 1 application topically every 2 (two) hours. With toileting    Historical Provider, MD   BP 138/82 mmHg  Pulse 75  Temp(Src) 98 F (36.7 C) (Axillary)  Resp 17  SpO2 100% Physical Exam  Nursing note and vitals reviewed. Constitutional:  Awake, alert, nontoxic appearance.  HENT:  3 cm laceration occipital scalp no foreign body noted no significant deep structure involvement noted  Eyes: Right eye exhibits no discharge. Left eye exhibits no discharge.  Neck: Neck supple.  Cervical spine nontender  Cardiovascular: Normal rate and regular rhythm.   No murmur heard. Pulmonary/Chest: Effort normal and breath sounds normal. No respiratory distress. She has no wheezes. She has no rales. She exhibits no tenderness.  RA sat normal 100%  Abdominal: Soft. There is no tenderness. There is no rebound.  Musculoskeletal: She exhibits no tenderness.  Baseline ROM, no obvious new focal weakness.  Neurological: She is alert.  Mental status and motor strength appears baseline for patient and situation. Patient speaks nonsensically with occasional unintelligible speech as well but smiles and moves all 4 extremities spontaneously but not to command.  Skin: No rash noted.  Psychiatric: She has a normal mood and affect.    ED Course  Procedures (including critical care time) Recheck after x-ray: patient's hips nontender with good passive range of motion with no apparent pain.  LACERATION REPAIR Performed by:  Hurman HornBEDNAR,Boen Sterbenz M Consent: Verbal consent obtained. Risks and benefits: risks, benefits and alternatives were discussed Patient identity confirmed: provided demographic data Time out performed prior to procedure Prepped and Draped in normal sterile fashion Wound explored Laceration Location: occipital scalp Laceration Length: 3cm No Foreign Bodies seen or palpated Irrigation method: spray bottle Safclens and water Amount of cleaning: standard Skin closure: hair twist Technique: Dermabond Patient tolerance: Patient tolerated the procedure well with no immediate complications. Labs Review Labs Reviewed - No data to display  Imaging Review No results found. Dg Pelvis 1-2 Views  11/15/2013   CLINICAL DATA:  Fall from bed at nursing facility. Possible head trauma. Patient unable to provide additional clinical information.  EXAM: PELVIS - 1-2 VIEW  COMPARISON:  Acute abdominal series May 05, 2013  FINDINGS: Status post RIGHT hip hemiarthroplasty, hardware appears intact and well seated. Patient is osteopenic. No definite fracture deformity. Levoscoliosis and possible lower lumbar laminectomies. Mild vascular calcifications. Ovoid calcification RIGHT pelvis could reflect phleboliths or leiomyoma.  IMPRESSION: Status post RIGHT hip hemiarthroplasty without radiographic findings of dislocation nor fracture deformity; patient is osteopenic which decreases sensitivity for acute nondisplaced fracture. If there is high clinical suspicion for occult hip fracture or the patient refuses to bear weight, consider further evaluation with MRI. Although CT is expeditious, evidence is lacking regarding accuracy of CT over plain film radiography.   Electronically Signed   By: Awilda Metroourtnay  Bloomer   On: 11/15/2013 00:20   Ct Head Wo Contrast  11/15/2013   CLINICAL DATA:  Larey SeatFell out of bed at nursing home today, laceration. Dementia.  EXAM: CT HEAD WITHOUT CONTRAST  TECHNIQUE: Contiguous axial images were obtained from  the base of the skull through the vertex without intravenous contrast.  COMPARISON:  CT of the head May 22, 2013  FINDINGS: Moderately motion degraded examination. Multiple attempts made every imaging without image quality improvement.  No intraparenchymal hemorrhage, mass effect or midline shift. Moderate to severe ventriculomegaly, likely on the basis of global parenchymal brain volume loss as there is overall commensurate enlargement of cerebral sulci and cerebellar folia, with somewhat disproportional temporal lobe volume loss. Patchy to confluent supratentorial white matter hypodensities.  No abnormal extra-axial fluid collections. Basal cisterns are patent. Moderate calcific atherosclerosis of the carotid siphons.  Small RIGHT parietal scalp hematoma. No skull fracture. The included ocular globes and orbital contents are non-suspicious. The mastoid aircells and included paranasal sinuses are well-aerated. The patient is edentulous.  IMPRESSION: Moderately motion degraded examination.  No definite acute intracranial process.  Moderate to severe parenchymal brain volume loss, advanced for age though, unchanged from prior imaging. Moderate to severe white matter changes suggest chronic small vessel ischemic disease.   Electronically Signed   By: Awilda Metroourtnay  Bloomer   On: 11/15/2013 00:41   EKG Interpretation None      MDM   Final diagnoses:  Fall  Laceration of head  Dementia    Patient / Family / Caregiver informed of clinical course, understand medical decision-making process, and agree with plan. I doubt any  other EMC precluding discharge at this time including, but not necessarily limited to the following:ICH.    Hurman Horn, MD 11/20/13 2002

## 2013-11-15 NOTE — Discharge Instructions (Signed)
SEEK IMMEDIATE MEDICAL ATTENTION IF: °There is redness, swelling, increasing pain in the wound, or a red line that goes up your arm or leg.  °Pus is coming from wound.  °An unexplained temperature above 100.4 develops.  °You notice a foul smell coming from the wound from beneath the Dermabond.  °There is a breaking open of the wound (edged not staying together) and the Dermabond breaks open. ° °You have had a head injury which does not appear to require admission at this time. A concussion is a state of changed mental ability from trauma. °SEEK IMMEDIATE MEDICAL ATTENTION IF: °There is confusion or drowsiness (although children frequently become drowsy after injury).  °You cannot awaken the injured person.  °There is nausea (feeling sick to your stomach) or continued, forceful vomiting.  °You notice dizziness or unsteadiness which is getting worse, or inability to walk.  °You have convulsions or unconsciousness.  °You experience severe, persistent headaches not relieved by Tylenol?. (Do not take aspirin as this impairs clotting abilities). Take other pain medications only as directed.  °You cannot use arms or legs normally.  °There are changes in pupil sizes. (This is the black center in the colored part of the eye)  °There is clear or bloody discharge from the nose or ears.  °Change in speech, vision, swallowing, or understanding.  °Localized weakness, numbness, tingling, or change in bowel or bladder control. °

## 2013-11-27 IMAGING — CT CT CERVICAL SPINE W/O CM
4 of 11 series · 12 of 33 positions shown, 13 images · non-contrast
Comparison: 11/07/2011.

CT HEAD

CLINICAL DATA: 71-year-old female found down.  Dementia.

CT HEAD WITHOUT CONTRAST
CT CERVICAL SPINE WITHOUT CONTRAST
TECHNIQUE: Multidetector CT imaging of the head and cervical spine
was performed following the standard protocol without intravenous
contrast.  Multiplanar CT image reconstructions of the cervical
spine were also generated.

[Series 7: cervical st 2.0 b31s · axial · 0.27mm/px · z∈[+132,+186]mm · 2 of 81 slices shown, 3 images]
[im 27/81  soft-tissue]
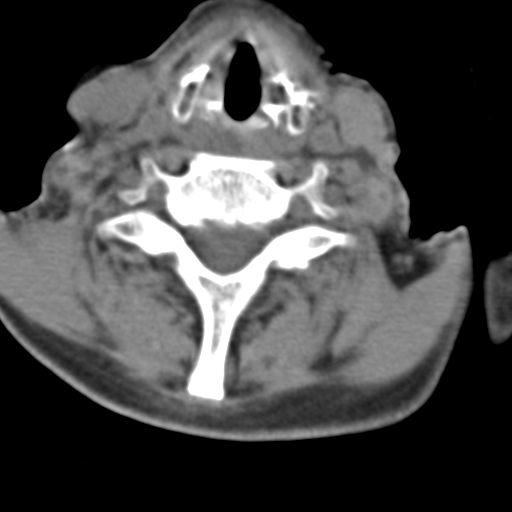
[im 27/81  bone]
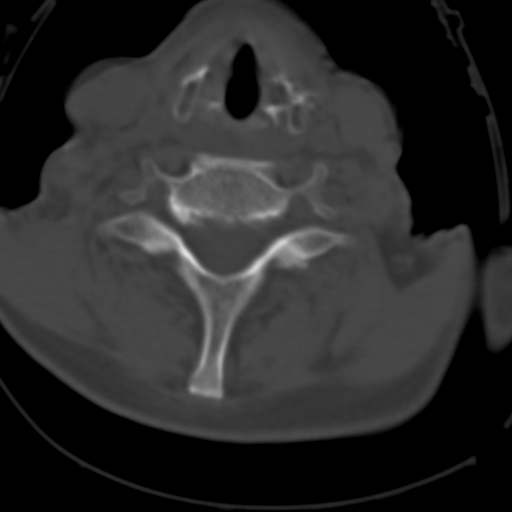
[im 54/81  bone]
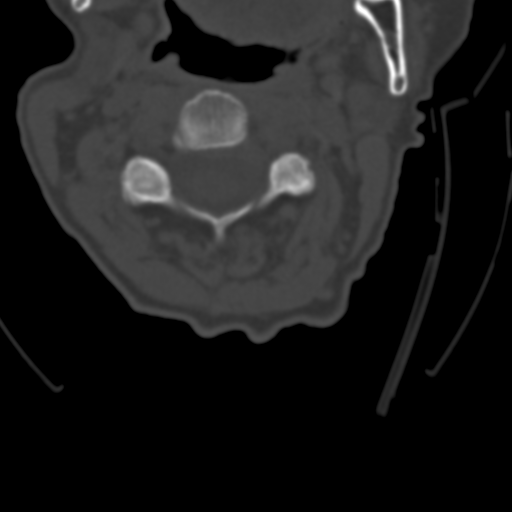

[Series 9: sagittal bone 2.0 · sagittal · 0.19mm/px · 5 of 50 slices shown]
[im 9/50  bone]
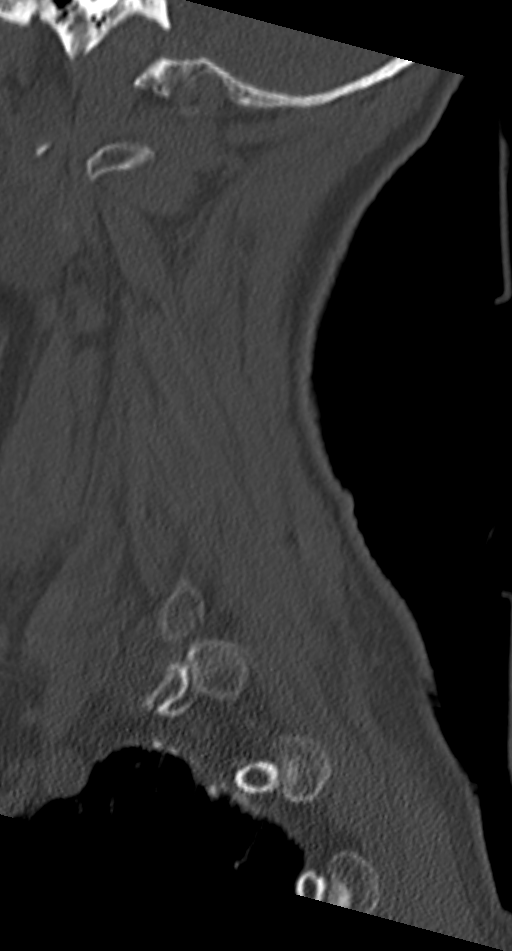
[im 17/50  bone]
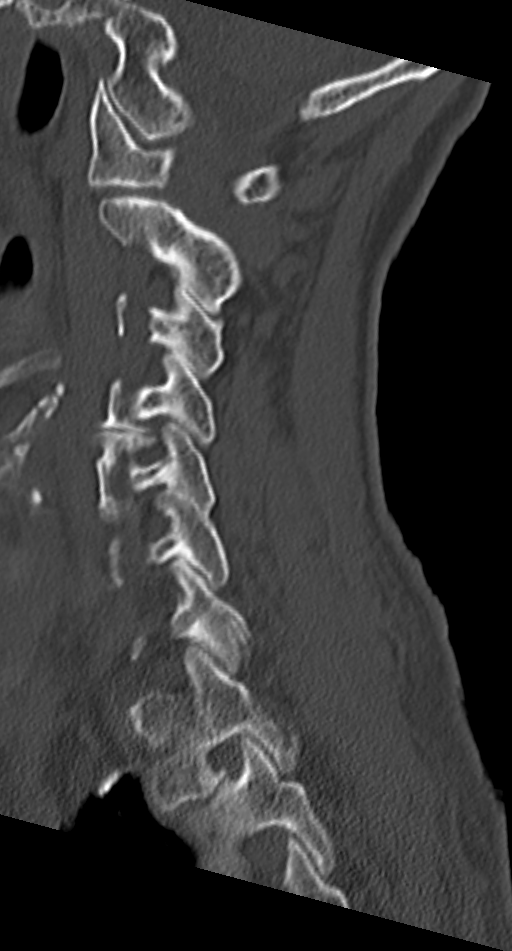
[im 25/50  bone]
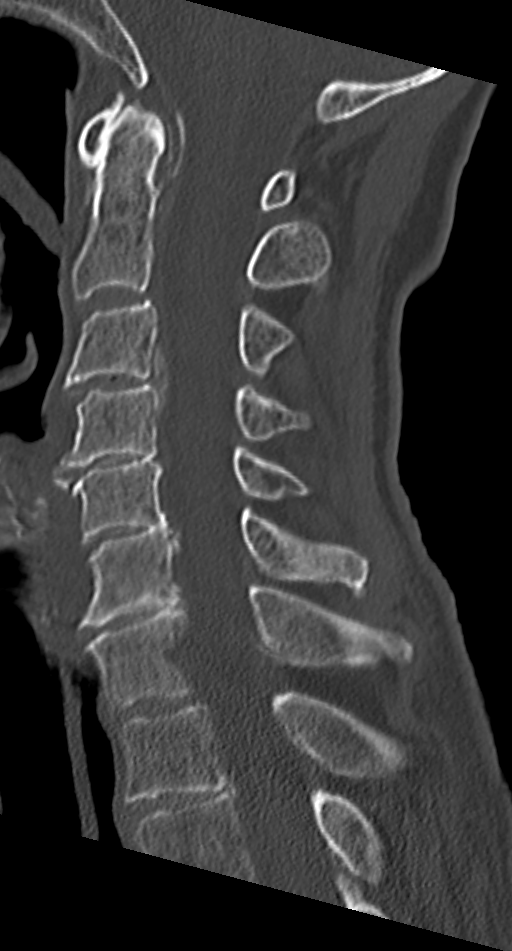
[im 33/50  bone]
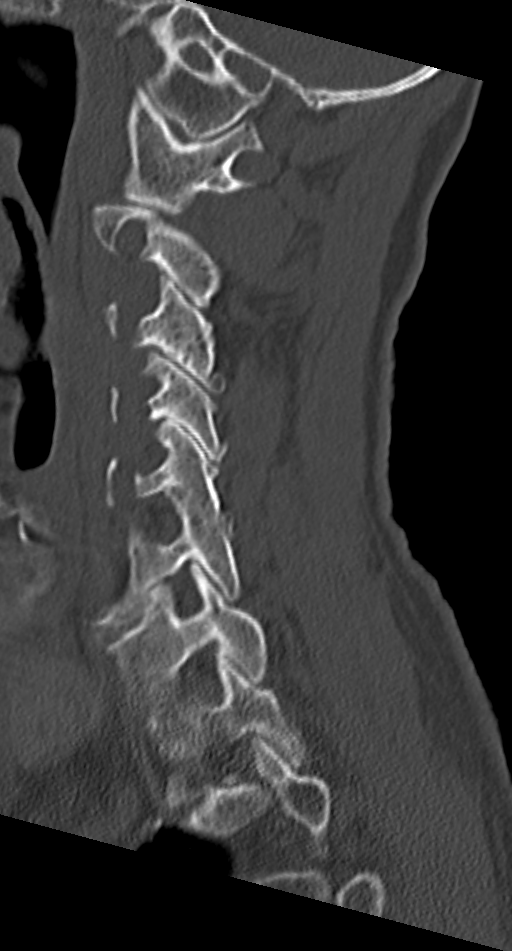
[im 41/50  bone]
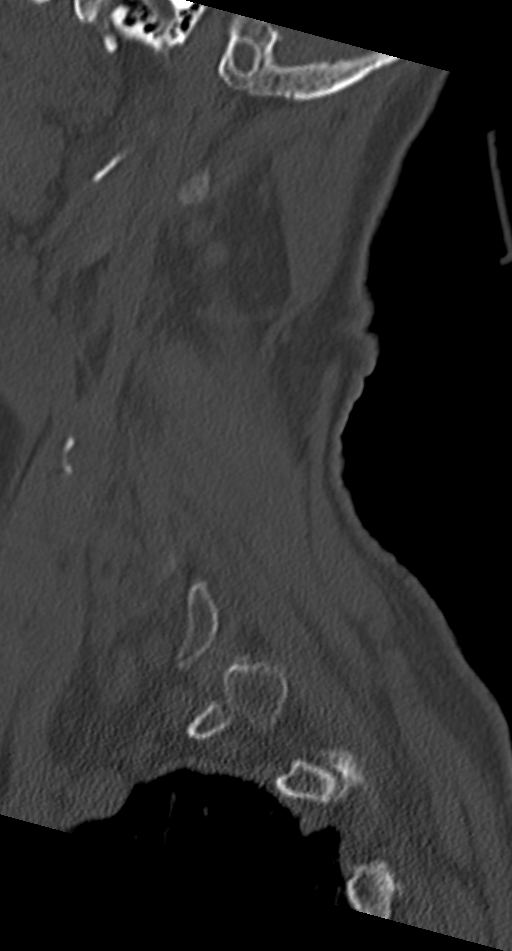

[Series 10: coronal bone 2.0 · coronal · 0.21mm/px · 3 of 53 slices shown]
[im 14/53  bone]
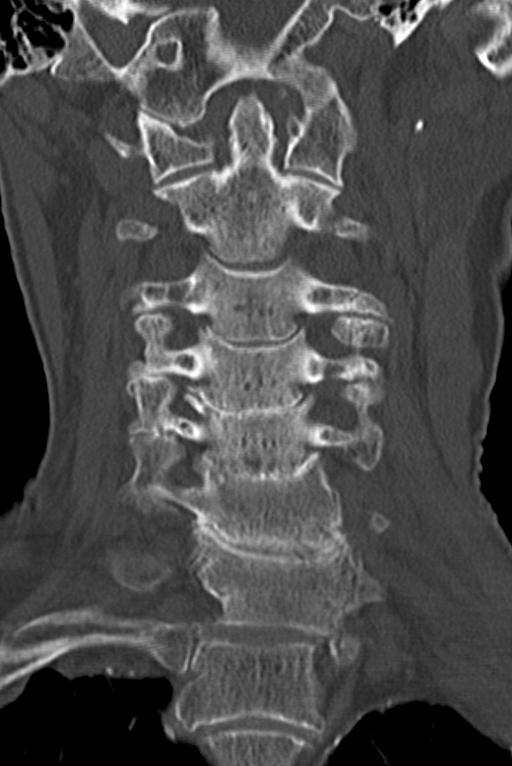
[im 27/53  bone]
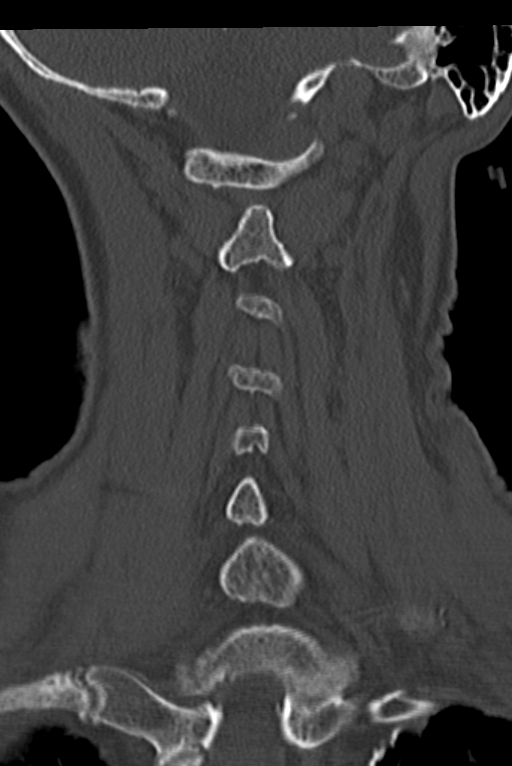
[im 40/53  bone]
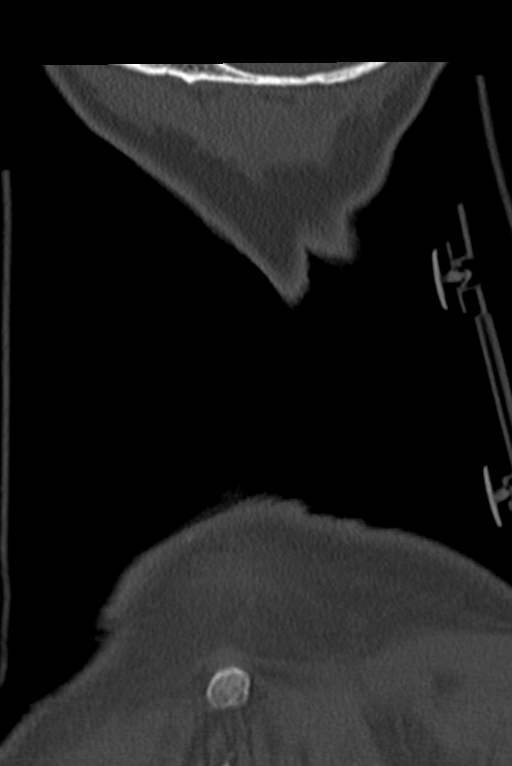

[Series 11: axial bone 2.0 · axial · 0.21mm/px · z∈[+114,+169]mm · 2 of 86 slices shown]
[im 29/86  bone]
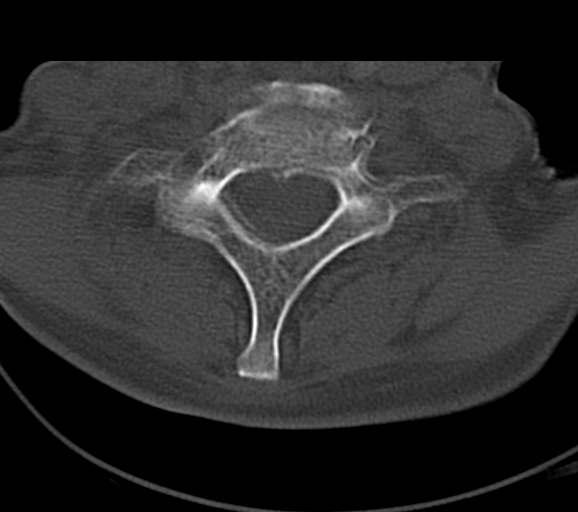
[im 57/86  bone]
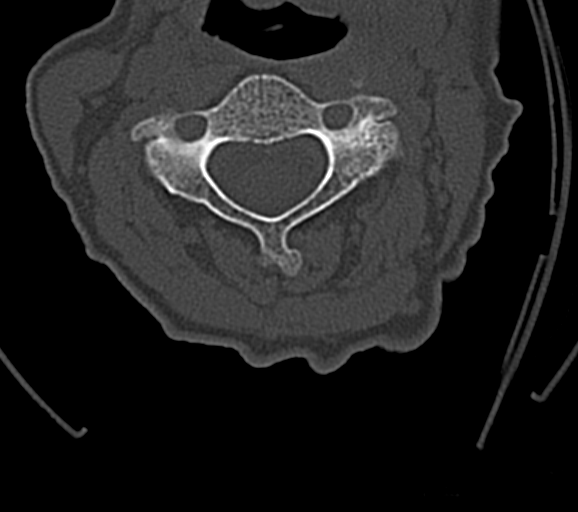

[12 of 33 positions shown; findings below may reference images not displayed]

FINDINGS: Visualized paranasal sinuses and mastoids are clear.
Visualized orbit soft tissues are within normal limits.  No scalp
hematoma identified.  Mild motion artifact despite repeated imaging
attempts.  No calvarial fracture identified.  Mild Calcified
atherosclerosis at the skull base.

Stable supratentorial cerebral volume loss. Stable ventricle size
and configuration.  No midline shift, mass effect, or evidence of
mass lesion.  Confluent cerebral white matter hypodensity is
stable.  Stable basal ganglia dystrophic calcifications. No acute
intracranial hemorrhage identified.  No evidence of cortically
based acute infarction identified.  No suspicious intracranial
vascular hyperdensity.
IMPRESSION: 1. Stable noncontrast CT appearance of the brain.  No acute
traumatic injury identified.
2.  Cervical spine findings are below.

CT CERVICAL SPINE
FINDINGS: Degenerative partially calcified ligamentous hypertrophy
about the odontoid.  Mild motion artifact despite repeated imaging
attempts.  Stable cervical vertebral height and alignment.
Visualized skull base is intact.  No atlanto-occipital
dissociation.  Stable cervicothoracic junction alignment. Bilateral
posterior element alignment is within normal limits.  Facet
ankylosis on the left at C5-C6. Multilevel chronic cervical disc
degeneration.  Partially calcified chronic disc herniation at C3-
C4.  Osteopenia.  No acute cervical spine fracture identified.
Chronic apical pulmonary scarring with some calcification.
Visualized paraspinal soft tissues are within normal limits.
IMPRESSION: No acute fracture or listhesis identified in the cervical spine.
Ligamentous injury is not excluded.

## 2014-08-09 ENCOUNTER — Emergency Department (HOSPITAL_COMMUNITY)
Admission: EM | Admit: 2014-08-09 | Discharge: 2014-08-09 | Disposition: A | Discharge status: Home / Self Care | Payer: Medicare Other | Attending: Emergency Medicine | Admitting: Emergency Medicine

## 2014-08-09 ENCOUNTER — Encounter (HOSPITAL_COMMUNITY): Payer: Self-pay | Admitting: Emergency Medicine

## 2014-08-09 DIAGNOSIS — Z791 Long term (current) use of non-steroidal anti-inflammatories (NSAID): Secondary | ICD-10-CM | POA: Insufficient documentation

## 2014-08-09 DIAGNOSIS — Y929 Unspecified place or not applicable: Secondary | ICD-10-CM | POA: Diagnosis not present

## 2014-08-09 DIAGNOSIS — Z8639 Personal history of other endocrine, nutritional and metabolic disease: Secondary | ICD-10-CM | POA: Diagnosis not present

## 2014-08-09 DIAGNOSIS — F419 Anxiety disorder, unspecified: Secondary | ICD-10-CM | POA: Diagnosis not present

## 2014-08-09 DIAGNOSIS — F329 Major depressive disorder, single episode, unspecified: Secondary | ICD-10-CM | POA: Diagnosis not present

## 2014-08-09 DIAGNOSIS — Z8719 Personal history of other diseases of the digestive system: Secondary | ICD-10-CM | POA: Diagnosis not present

## 2014-08-09 DIAGNOSIS — Y939 Activity, unspecified: Secondary | ICD-10-CM | POA: Insufficient documentation

## 2014-08-09 DIAGNOSIS — S51002A Unspecified open wound of left elbow, initial encounter: Secondary | ICD-10-CM | POA: Insufficient documentation

## 2014-08-09 DIAGNOSIS — F0390 Unspecified dementia without behavioral disturbance: Secondary | ICD-10-CM

## 2014-08-09 DIAGNOSIS — R4182 Altered mental status, unspecified: Secondary | ICD-10-CM | POA: Diagnosis present

## 2014-08-09 DIAGNOSIS — I10 Essential (primary) hypertension: Secondary | ICD-10-CM | POA: Diagnosis not present

## 2014-08-09 DIAGNOSIS — X58XXXA Exposure to other specified factors, initial encounter: Secondary | ICD-10-CM | POA: Diagnosis not present

## 2014-08-09 DIAGNOSIS — Z8781 Personal history of (healed) traumatic fracture: Secondary | ICD-10-CM | POA: Diagnosis not present

## 2014-08-09 DIAGNOSIS — G3183 Dementia with Lewy bodies: Secondary | ICD-10-CM | POA: Diagnosis not present

## 2014-08-09 DIAGNOSIS — R55 Syncope and collapse: Secondary | ICD-10-CM | POA: Diagnosis not present

## 2014-08-09 DIAGNOSIS — Y999 Unspecified external cause status: Secondary | ICD-10-CM | POA: Insufficient documentation

## 2014-08-09 DIAGNOSIS — Z7982 Long term (current) use of aspirin: Secondary | ICD-10-CM | POA: Diagnosis not present

## 2014-08-09 DIAGNOSIS — Z79899 Other long term (current) drug therapy: Secondary | ICD-10-CM | POA: Diagnosis not present

## 2014-08-09 NOTE — ED Notes (Signed)
MD at bedside. 

## 2014-08-09 NOTE — ED Notes (Signed)
Ems Called for pt decreased LOC and drooping of mouth.  LKW unknown by staff.  EMS reports that pt did have drooping of mouth on arrival but during route to ED, pt did start talking and there was no drooping of the mouth.  Pt has a history of dementia.  Pt is wheelchair bound.

## 2014-08-09 NOTE — ED Notes (Signed)
Skin tear to left elbow on arrival to ED.

## 2014-08-09 NOTE — ED Provider Notes (Signed)
CSN: [id number]     Arrival date & time 08/09/14  0901 History  This chart was scribed for Azalia Bilis, MD by Octavia Heir, ED Scribe. This patient was seen in room APA06/APA06 and the patient's care was started at 9:18 AM.    Chief Complaint  Patient presents with  . Altered Mental Status    LEVEL 5 CAVEAT DUE TO ALTERED MENTAL STATUS  The history is provided by the EMS personnel. No language interpreter was used.   HPI Comments: Jillian King is a 74 y.o. female who has a hx of dementia presents to the Emergency Department with altered mental status. Per EMS, they were called by place where pt residents for observed LOC and drooping of the mouth. When they were on route to the ED, pt was talking and there was no droop. Pt is wheelchair bound  Past Medical History  Diagnosis Date  . Dementia   . GERD (gastroesophageal reflux disease)   . Hypokalemia   . Hip fracture   . Anxiety   . Depression   . Hypertension    No past surgical history on file. No family history on file. History  Substance Use Topics  . Smoking status: Never Smoker   . Smokeless tobacco: Not on file  . Alcohol Use: No   OB History    No data available     Review of Systems  A complete 10 system review of systems was obtained and all systems are negative except as noted in the HPI and PMH.    Allergies  Review of patient's allergies indicates no known allergies.  Home Medications   Prior to Admission medications   Medication Sig Start Date End Date Taking? Authorizing Provider  acetaminophen (TYLENOL) 650 MG CR tablet Take 650 mg by mouth 2 (two) times daily.     Historical Provider, MD  aspirin 81 MG chewable tablet Chew 81 mg by mouth daily.      Historical Provider, MD  Calcium Carb-Cholecalciferol (CALCIUM 600 + D PO) Take 1 tablet by mouth 3 (three) times daily.    Historical Provider, MD  lactulose (CHRONULAC) 10 GM/15ML solution Take 30 g by mouth every other day.    Historical Provider,  MD  Multiple Vitamin (MULTIVITAMIN) LIQD Take 5 mLs by mouth daily.    Historical Provider, MD  polyethylene glycol (MIRALAX / GLYCOLAX) packet Take 17 g by mouth at bedtime.    Historical Provider, MD  rivastigmine (EXELON) 3 MG capsule Take 3 mg by mouth 2 (two) times daily.      Historical Provider, MD  traZODone (DESYREL) 50 MG tablet Take 50 mg by mouth at bedtime.    Historical Provider, MD  zinc oxide 20 % ointment Apply 1 application topically every 2 (two) hours. With toileting    Historical Provider, MD   Triage vitals: 114/74 mmHg  Pulse 62  Temp(Src) 97.7 F (36.5 C) (Rectal)  Resp 14  SpO2 97% Physical Exam  Constitutional: She appears well-developed and well-nourished. No distress.  HENT:  Head: Normocephalic and atraumatic.  No obvious trauma or hematomas noted to her head  Eyes: EOM are normal.  Neck: Normal range of motion.  Cardiovascular: Normal rate, regular rhythm and normal heart sounds.   Pulmonary/Chest: Effort normal and breath sounds normal.  Abdominal: Soft. She exhibits no distension. There is no tenderness.  Musculoskeletal: Normal range of motion.  Skin tear on left elbow Full ROM of major joints  Neurological: She is alert.  Skin:  Skin is warm and dry.  Psychiatric: Judgment normal.  Nursing note and vitals reviewed.   ED Course  Procedures  DIAGNOSTIC STUDIES: Oxygen Saturation is 97% on RA, normal by my interpretation.  COORDINATION OF CARE:  9:23 AM Discussed treatment plan which includes follow up if persists with pt at bedside and pt agreed to plan.  Labs Review Labs Reviewed - No data to display  Imaging Review No results found.   EKG Interpretation   Date/Time:  Wednesday August 09 2014 09:06:58 EDT Ventricular Rate:  59 PR Interval:  67 QRS Duration: 89 QT Interval:  502 QTC Calculation: 497 R Axis:   66 Text Interpretation:  Sinus rhythm Short PR interval Anteroseptal infarct,  age indeterminate No significant change was  found Interpretation limited  secondary to artifact Confirmed by Tannie Koskela  MD, Caryn BeeKEVIN ([id number]) on 08/09/2014  9:47:20 AM      MDM   Final diagnoses:  None    Overall seems that the patient is at her baseline.  She has no focal neurologic deficits at this time.  There is no facial droop.  She has advanced Lewy body dementia.  She is unable to provide any history.  I do not think with normal vital signs of aggressive workup is warranted.  Facial be discharged from emergency department and sent back to her nursing facility.  She'll need to be assessed by her primary team this week.  Vas that nursing staff return the patient to the emergency department for any new or worsening symptoms that concern them.  My emergency department nurse spoke with the patient's son who is agreeable to plan of minimal workup  I personally performed the services described in this documentation, which was scribed in my presence. The recorded information has been reviewed and is accurate.     Azalia BilisKevin Shaida Route, MD 08/09/14 [phone]

## 2014-08-09 NOTE — ED Notes (Signed)
Pt not able to sign discharge.  Spoke with Jonny RuizJohn (son) and Wynema [facility]ia [facility].  Ms. Susette RacerHardy plans to have NP see pt tomorrow [facility].

## 2014-11-07 ENCOUNTER — Emergency Department (HOSPITAL_COMMUNITY)
Admission: EM | Admit: 2014-11-07 | Discharge: 2014-11-07 | Disposition: A | Discharge status: Home / Self Care | Payer: Medicare Other | Attending: Emergency Medicine | Admitting: Emergency Medicine

## 2014-11-07 ENCOUNTER — Emergency Department (HOSPITAL_COMMUNITY): Payer: Medicare Other

## 2014-11-07 ENCOUNTER — Encounter (HOSPITAL_COMMUNITY): Payer: Self-pay | Admitting: Emergency Medicine

## 2014-11-07 DIAGNOSIS — Z8719 Personal history of other diseases of the digestive system: Secondary | ICD-10-CM | POA: Diagnosis not present

## 2014-11-07 DIAGNOSIS — T148XXA Other injury of unspecified body region, initial encounter: Secondary | ICD-10-CM

## 2014-11-07 DIAGNOSIS — I10 Essential (primary) hypertension: Secondary | ICD-10-CM | POA: Insufficient documentation

## 2014-11-07 DIAGNOSIS — Z7982 Long term (current) use of aspirin: Secondary | ICD-10-CM | POA: Diagnosis not present

## 2014-11-07 DIAGNOSIS — F0390 Unspecified dementia without behavioral disturbance: Secondary | ICD-10-CM | POA: Insufficient documentation

## 2014-11-07 DIAGNOSIS — S51802A Unspecified open wound of left forearm, initial encounter: Secondary | ICD-10-CM | POA: Insufficient documentation

## 2014-11-07 DIAGNOSIS — S51801A Unspecified open wound of right forearm, initial encounter: Secondary | ICD-10-CM | POA: Diagnosis not present

## 2014-11-07 DIAGNOSIS — Z8639 Personal history of other endocrine, nutritional and metabolic disease: Secondary | ICD-10-CM | POA: Diagnosis not present

## 2014-11-07 DIAGNOSIS — Y9289 Other specified places as the place of occurrence of the external cause: Secondary | ICD-10-CM | POA: Diagnosis not present

## 2014-11-07 DIAGNOSIS — Z79899 Other long term (current) drug therapy: Secondary | ICD-10-CM | POA: Insufficient documentation

## 2014-11-07 DIAGNOSIS — S59912A Unspecified injury of left forearm, initial encounter: Secondary | ICD-10-CM | POA: Diagnosis present

## 2014-11-07 DIAGNOSIS — Y9389 Activity, other specified: Secondary | ICD-10-CM | POA: Diagnosis not present

## 2014-11-07 DIAGNOSIS — Y998 Other external cause status: Secondary | ICD-10-CM | POA: Insufficient documentation

## 2014-11-07 DIAGNOSIS — W19XXXA Unspecified fall, initial encounter: Secondary | ICD-10-CM

## 2014-11-07 DIAGNOSIS — W1839XA Other fall on same level, initial encounter: Secondary | ICD-10-CM | POA: Insufficient documentation

## 2014-11-07 MED ORDER — TETANUS-DIPHTH-ACELL PERTUSSIS 5-2.5-18.5 LF-MCG/0.5 IM SUSP
0.5000 mL | Freq: Once | INTRAMUSCULAR | Status: AC
  Administered 2014-11-07: 0.5 mL via INTRAMUSCULAR
  Filled 2014-11-07: qty 0.5

## 2014-11-07 NOTE — ED Notes (Signed)
Slight redness noted over old right hip scar.  Md at bedside.

## 2014-11-07 NOTE — ED Provider Notes (Signed)
CSN: [id number]     Arrival date & time    History  By signing my name below, I, Marica Otter, attest that this documentation has been prepared under the direction and in the presence of Glynn Octave, MD. Electronically Signed: Marica Otter, ED Scribe. 11/07/2014. 9:18 AM.  LEVEL 5 CAVEAT: DEMENTIA  Chief Complaint  Patient presents with  . Fall   HPI PCP: Galvin Proffer, MD HPI Comments: Jillian King is a 74 y.o. female, with PMHx noted below including dementia, is a [place] resident, who presents to the Emergency Department complaining of an unwitnessed fall sustained PTA to the ED.   Past Medical History  Diagnosis Date  . Dementia   . GERD (gastroesophageal reflux disease)   . Hypokalemia   . Hip fracture (HCC)   . Anxiety   . Depression   . Hypertension    History reviewed. No pertinent past surgical history. History reviewed. No pertinent family history. Social History  Substance Use Topics  . Smoking status: Never Smoker   . Smokeless tobacco: None  . Alcohol Use: No   OB History    No data available     Review of Systems  Unable to perform ROS: Dementia   Allergies  Review of patient's allergies indicates no known allergies.  Home Medications   Prior to Admission medications   Medication Sig Start Date End Date Taking? Authorizing Provider  aspirin 81 MG chewable tablet Chew 81 mg by mouth daily.     Yes Historical Provider, MD  lactulose (CHRONULAC) 10 GM/15ML solution Take 30 g by mouth every other day.   Yes Historical Provider, MD  polyethylene glycol (MIRALAX / GLYCOLAX) packet Take 17 g by mouth at bedtime.   Yes Historical Provider, MD  rivastigmine (EXELON) 3 MG capsule Take 3 mg by mouth 2 (two) times daily.     Yes Historical Provider, MD   Triage Vitals: BP 104/66 mmHg  Pulse 74  Temp(Src) 97.6 F (36.4 C) (Oral)  Resp 16  Ht  (1.626 m)  Wt 125 lb (56.7 kg)  BMI 21.45 kg/m2  SpO2 100% Physical Exam  Constitutional: She  appears well-developed and well-nourished. No distress.  HENT:  Head: Normocephalic and atraumatic.  Mouth/Throat: Oropharynx is clear and moist. No oropharyngeal exudate.  Eyes: Conjunctivae and EOM are normal. Pupils are equal, round, and reactive to light.  Neck: Normal range of motion. Neck supple.  No meningismus.  Cardiovascular: Normal rate, regular rhythm, normal heart sounds and intact distal pulses.   No murmur heard. Pulmonary/Chest: Effort normal and breath sounds normal. No respiratory distress. She exhibits no tenderness.  Abdominal: Soft. There is no tenderness. There is no rebound and no guarding.  Musculoskeletal: Normal range of motion. She exhibits no edema or tenderness.       Cervical back: She exhibits no tenderness.  Moves all extremities spontaneously, does not respond to commands.  Slight erythema to right hip overlying old scar.  No signs of head trauma.   Neurological: She is alert. No cranial nerve deficit. She exhibits normal muscle tone. Coordination normal.  Nonverbal Contractures of extremities  Skin: Skin is warm.  Semicircular tear to right forearm 3cm Semicircular tear to left forearm 1cm    Nursing note and vitals reviewed.   ED Course  Procedures (including critical care time) DIAGNOSTIC STUDIES: Oxygen Saturation is 100% on RA, nl by my interpretation.    Imaging Review Dg Chest 1 View  11/07/2014  CLINICAL DATA:  Fall EXAM: CHEST 1 VIEW COMPARISON:  05/22/2013 FINDINGS: Cardiomediastinal silhouette is stable. No acute infiltrate or pleural effusion. No pulmonary edema. There is no pneumothorax. Hyperinflation again noted. Stable chronic mild interstitial IMPRESSION: No active disease. Stable hyperinflation and chronic mild interstitial prominence. Electronically Signed   By: Natasha MeadLiviu  Pop M.D.   On: 11/07/2014 09:45   Dg Pelvis 1-2 Views  11/07/2014  CLINICAL DATA:  Fall EXAM: PELVIS - 1-2 VIEW COMPARISON:  11/14/2013 FINDINGS: Right hip  replacement. Normal alignment of both hip joints. Negative for pelvic fracture. Osteopenia with decreased bone mineralization. IMPRESSION: Negative for fracture. Electronically Signed   By: Marlan Palauharles  Clark M.D.   On: 11/07/2014 09:45   Dg Forearm Right  11/07/2014  CLINICAL DATA:  Fall EXAM: RIGHT FOREARM - 2 VIEW COMPARISON:  None. FINDINGS: There is no evidence of fracture or other focal bone lesions. Soft tissues are unremarkable. IMPRESSION: Negative. Electronically Signed   By: Marlan Palauharles  Clark M.D.   On: 11/07/2014 09:44   Ct Head Wo Contrast  11/07/2014  CLINICAL DATA:  Unwitnessed fall. EXAM: CT HEAD WITHOUT CONTRAST CT CERVICAL SPINE WITHOUT CONTRAST TECHNIQUE: Multidetector CT imaging of the head and cervical spine was performed following the standard protocol without intravenous contrast. Multiplanar CT image reconstructions of the cervical spine were also generated. COMPARISON:  November 15, 2013. FINDINGS: CT HEAD FINDINGS Bony calvarium appears intact. Moderate diffuse cortical atrophy is noted. Mild chronic ischemic white matter disease is noted. No mass effect or midline shift is noted. Ventricular size is within normal limits given the degree of atrophy. There is no evidence of mass lesion, hemorrhage or acute infarction. CT CERVICAL SPINE FINDINGS No acute fracture or spondylolisthesis is noted. Severe degenerative disc disease is noted at C3-4, C4-5, C5-6 and C6-7. Hypertrophy of posterior facet joints is noted on the left secondary to degenerative change. Visualized lung apices are unremarkable. IMPRESSION: Moderate diffuse cortical atrophy. Mild chronic ischemic white matter disease. No acute intracranial abnormality seen. 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 No acute fracture or spondylolisthesis is noted. Electronically Signed   By: Lupita RaiderJames  Green Jr, M.D.   On: 11/07/2014 10:22   Ct Cervical Spine Wo Contrast  11/07/2014  CLINICAL DATA:   Unwitnessed fall. EXAM: CT HEAD WITHOUT CONTRAST CT CERVICAL SPINE WITHOUT CONTRAST TECHNIQUE: Multidetector CT imaging of the head and cervical spine was performed following the standard protocol without intravenous contrast. Multiplanar CT image reconstructions of the cervical spine were also generated. COMPARISON:  November 15, 2013. FINDINGS: CT HEAD FINDINGS Bony calvarium appears intact. Moderate diffuse cortical atrophy is noted. Mild chronic ischemic white matter disease is noted. No mass effect or midline shift is noted. Ventricular size is within normal limits given the degree of atrophy. There is no evidence of mass lesion, hemorrhage or acute infarction. CT CERVICAL SPINE FINDINGS No acute fracture or spondylolisthesis is noted. Severe degenerative disc disease is noted at C3-4, C4-5, C5-6 and C6-7. Hypertrophy of posterior facet joints is noted on the left secondary to degenerative change. Visualized lung apices are unremarkable. IMPRESSION: Moderate diffuse cortical atrophy. Mild chronic ischemic white matter disease. No acute intracranial abnormality seen. 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 No acute fracture or spondylolisthesis is noted. Electronically Signed   By: Lupita RaiderJames  Green Jr, M.D.   On: 11/07/2014 10:22   I have personally reviewed and evaluated these images as part of my medical decision-making.  MDM   Final diagnoses:  Fall  Dementia patient from nursing home with unwitnessed fall. Has skin tear to right forearm and left hand. She is nonverbal and unable to give a history. No evidence of head trauma. No blood thinner use.  Imaging negative for acute traumatic injury. Tetanus updated.  Steri-Strips applied over right forearm skin tear.  Patient seems to be at her baseline, no significant trauma from fall. Stable to return to nursing home.  I personally performed the services described in this documentation, which was scribed in my  presence. The recorded information has been reviewed and is accurate.    Glynn Octave, MD 11/07/14 1052

## 2014-11-07 NOTE — ED Notes (Signed)
Jillian King, Med Tech called and report given.

## 2014-11-07 NOTE — Discharge Instructions (Signed)

## 2014-11-07 NOTE — ED Notes (Signed)
EMS called for d/c transport by nurse secretary.

## 2014-11-07 NOTE — ED Notes (Signed)
[facility] in [facility].  Skin tear to right forearm, with dressing intact.  Small skin tear to left forearm and left hand.  No apparent head injury.  EMS says pt has history of dementia and legs and arms are contracted.

## 2014-11-07 NOTE — ED Notes (Signed)
MD at bedside. 

## 2015-01-15 ENCOUNTER — Inpatient Hospital Stay (HOSPITAL_COMMUNITY)
Admission: EM | Admit: 2015-01-15 | Discharge: 2015-01-18 | DRG: 682 | Disposition: A | Discharge status: Hospice | Payer: Medicare Other | Attending: Internal Medicine | Admitting: Internal Medicine

## 2015-01-15 ENCOUNTER — Encounter (HOSPITAL_COMMUNITY): Payer: Self-pay | Admitting: Cardiology

## 2015-01-15 DIAGNOSIS — Z681 Body mass index (BMI) 19 or less, adult: Secondary | ICD-10-CM | POA: Diagnosis not present

## 2015-01-15 DIAGNOSIS — E43 Unspecified severe protein-calorie malnutrition: Secondary | ICD-10-CM | POA: Diagnosis present

## 2015-01-15 DIAGNOSIS — F0391 Unspecified dementia with behavioral disturbance: Secondary | ICD-10-CM | POA: Diagnosis present

## 2015-01-15 DIAGNOSIS — Z515 Encounter for palliative care: Secondary | ICD-10-CM | POA: Diagnosis not present

## 2015-01-15 DIAGNOSIS — E860 Dehydration: Secondary | ICD-10-CM | POA: Diagnosis present

## 2015-01-15 DIAGNOSIS — R627 Adult failure to thrive: Secondary | ICD-10-CM | POA: Diagnosis present

## 2015-01-15 DIAGNOSIS — R531 Weakness: Secondary | ICD-10-CM | POA: Diagnosis not present

## 2015-01-15 DIAGNOSIS — Z7982 Long term (current) use of aspirin: Secondary | ICD-10-CM | POA: Diagnosis not present

## 2015-01-15 DIAGNOSIS — K219 Gastro-esophageal reflux disease without esophagitis: Secondary | ICD-10-CM | POA: Diagnosis present

## 2015-01-15 DIAGNOSIS — Z66 Do not resuscitate: Secondary | ICD-10-CM | POA: Diagnosis present

## 2015-01-15 DIAGNOSIS — I10 Essential (primary) hypertension: Secondary | ICD-10-CM | POA: Diagnosis present

## 2015-01-15 DIAGNOSIS — N179 Acute kidney failure, unspecified: Secondary | ICD-10-CM | POA: Diagnosis present

## 2015-01-15 DIAGNOSIS — E870 Hyperosmolality and hypernatremia: Secondary | ICD-10-CM | POA: Diagnosis present

## 2015-01-15 DIAGNOSIS — F03918 Unspecified dementia, unspecified severity, with other behavioral disturbance: Secondary | ICD-10-CM | POA: Diagnosis present

## 2015-01-15 DIAGNOSIS — G9340 Encephalopathy, unspecified: Secondary | ICD-10-CM

## 2015-01-15 LAB — BASIC METABOLIC PANEL WITH GFR
Anion gap: 10 (ref 5–15)
BUN: 62 mg/dL — ABNORMAL HIGH (ref 6–20)
CO2: 26 mmol/L (ref 22–32)
Calcium: 10.2 mg/dL (ref 8.9–10.3)
Chloride: 138 mmol/L (ref 101–111)
Creatinine, Ser: 1.53 mg/dL — ABNORMAL HIGH (ref 0.44–1.00)
GFR calc Af Amer: 38 mL/min — ABNORMAL LOW
GFR calc non Af Amer: 32 mL/min — ABNORMAL LOW
Glucose, Bld: 118 mg/dL — ABNORMAL HIGH (ref 65–99)
Potassium: 3.6 mmol/L (ref 3.5–5.1)
Sodium: 174 mmol/L (ref 135–145)

## 2015-01-15 LAB — CBC WITH DIFFERENTIAL/PLATELET
Basophils Absolute: 0.1 10*3/uL (ref 0.0–0.1)
Basophils Relative: 0 %
EOS ABS: 0.1 10*3/uL (ref 0.0–0.7)
EOS PCT: 1 %
HCT: 60.1 % — ABNORMAL HIGH (ref 36.0–46.0)
HEMOGLOBIN: 18.1 g/dL — AB (ref 12.0–15.0)
LYMPHS ABS: 2.7 10*3/uL (ref 0.7–4.0)
LYMPHS PCT: 24 %
MCH: 31.2 pg (ref 26.0–34.0)
MCHC: 30.1 g/dL (ref 30.0–36.0)
MCV: 103.6 fL — AB (ref 78.0–100.0)
MONOS PCT: 8 %
Monocytes Absolute: 0.9 10*3/uL (ref 0.1–1.0)
Neutro Abs: 7.8 10*3/uL — ABNORMAL HIGH (ref 1.7–7.7)
Neutrophils Relative %: 67 %
PLATELETS: 99 10*3/uL — AB (ref 150–400)
RBC: 5.8 MIL/uL — AB (ref 3.87–5.11)
RDW: 14.2 % (ref 11.5–15.5)
WBC: 11.5 10*3/uL — AB (ref 4.0–10.5)

## 2015-01-15 LAB — URINALYSIS, ROUTINE W REFLEX MICROSCOPIC
Bilirubin Urine: NEGATIVE
Glucose, UA: NEGATIVE mg/dL
Ketones, ur: NEGATIVE mg/dL
Nitrite: NEGATIVE
Protein, ur: 100 mg/dL — AB
Specific Gravity, Urine: 1.025 (ref 1.005–1.030)
pH: 6 (ref 5.0–8.0)

## 2015-01-15 LAB — URINE MICROSCOPIC-ADD ON

## 2015-01-15 LAB — TROPONIN I

## 2015-01-15 MED ORDER — ONDANSETRON HCL 4 MG/2ML IJ SOLN: 4.0000 mg | Freq: Four times a day (QID) | INTRAMUSCULAR | Status: DC | PRN

## 2015-01-15 MED ORDER — NUTRITIONAL SHAKE PO LIQD: Freq: Two times a day (BID) | ORAL | Status: DC

## 2015-01-15 MED ORDER — ASPIRIN 81 MG PO CHEW
81.0000 mg | CHEWABLE_TABLET | Freq: Every day | ORAL | Status: DC
  Filled 2015-01-15 (×2): qty 1

## 2015-01-15 MED ORDER — ENOXAPARIN SODIUM 30 MG/0.3ML ~~LOC~~ SOLN
30.0000 mg | SUBCUTANEOUS | Status: DC
Start: 2015-01-15 — End: 2015-01-15

## 2015-01-15 MED ORDER — ONDANSETRON HCL 4 MG PO TABS: 4.0000 mg | ORAL_TABLET | Freq: Four times a day (QID) | ORAL | Status: DC | PRN

## 2015-01-15 MED ORDER — SODIUM CHLORIDE 0.9 % IV BOLUS (SEPSIS)
1000.0000 mL | Freq: Once | INTRAVENOUS | Status: AC
  Administered 2015-01-15: 1000 mL via INTRAVENOUS

## 2015-01-15 MED ORDER — LACTULOSE 10 GM/15ML PO SOLN
30.0000 g | ORAL | Status: DC
  Filled 2015-01-15 (×2): qty 60

## 2015-01-15 MED ORDER — SODIUM CHLORIDE 0.9 % IV SOLN
INTRAVENOUS | Status: DC
  Administered 2015-01-15: 21:00:00 via INTRAVENOUS

## 2015-01-15 NOTE — ED Notes (Signed)
In and out cath. Patient had 40 cc

## 2015-01-15 NOTE — ED Notes (Signed)
Attempted in and out 2 more times.  Unable to get urine.  Bed wet.  Cleaned pt up.

## 2015-01-15 NOTE — ED Notes (Signed)
cathed pt with no urine return.

## 2015-01-15 NOTE — ED Notes (Addendum)
[facility] states that she is letting the food run out of her mouth and not eating it. She is normally claps her hands, talks, and laughs, and lately she is sleeping more not talking, laughing, or clapping. Jillian King also states that this has been going on for a week or longer.

## 2015-01-15 NOTE — H&P (Signed)
[facility] History and Physical  Jillian MunroeClara J Hur UJW:[id number]:[id number] DOB: 12/08/1940 DOA: 01/15/2015  Referring physician: ER PCP: Galvin ProfferHAGUE, IMRAN P, MD   Chief Complaint: Weakness, poor by mouth intake  HPI: Jillian King is a 74 y.o. female  This is a 74 year old lady who has severe dementia and who [facility]. She apparently has had poor by mouth intake for the last 3-4 days, food is running out of her mouth and she is not eating. She has been more sleepy and lethargic lately in the last few days. The patient cannot herself give any clear history whatsoever because of her severe dementia. There are no family members present. Evaluation in the emergency room shows that she is extremely dehydrated with sodium of 174. She is now being admitted for further management.   Review of Systems:  Unable to obtain secondary to the patient's severe dementia.  Past Medical History  Diagnosis Date  . Dementia   . GERD (gastroesophageal reflux disease)   . Hypokalemia   . Hip fracture (HCC)   . Anxiety   . Depression   . Hypertension    History reviewed. No pertinent past surgical history. Social History:  reports that she has never smoked. She does not have any smokeless tobacco history on file. She reports that she does not drink alcohol or use illicit drugs.  No Known Allergies   Family history unobtainable secondary to the patient's dementia.  Prior to Admission medications   Medication Sig Start Date End Date Taking? Authorizing Provider  aspirin 81 MG chewable tablet Chew 81 mg by mouth daily.     Yes Historical Provider, MD  lactulose (CHRONULAC) 10 GM/15ML solution Take 30 g by mouth every other day.   Yes Historical Provider, MD  Nutritional Supplements (NUTRITIONAL SHAKE PO) Take 1 Can by mouth 2 (two) times daily.   Yes Historical Provider, MD  polyethylene glycol (MIRALAX / GLYCOLAX) packet Take 17 g by mouth at bedtime.   Yes Historical Provider, MD  rivastigmine  (EXELON) 3 MG capsule Take 3 mg by mouth 2 (two) times daily.     Yes Historical Provider, MD   Physical Exam: Filed Vitals:   01/15/15 1714 01/15/15 1730 01/15/15 1800 01/15/15 1851  BP: 100/87 104/82 104/88   Pulse: 94     Temp: 97.9 F (36.6 C)   99.5 F (37.5 C)  TempSrc: Oral   Rectal  Resp: 16  16   Weight: 56.7 kg (125 lb)     SpO2: 100%       Wt Readings from Last 3 Encounters:  01/15/15 56.7 kg (125 lb)  11/07/14 56.7 kg (125 lb)  12/30/11 56.7 kg (125 lb)    General:  Appears very dehydrated. Thankfully, her blood pressure is being maintained. She does not look septic clinically. She is largely nonverbal at this point. Eyes: PERRL, normal lids, irises & conjunctiva ENT: grossly normal hearing, lips & tongue Neck: no LAD, masses or thyromegaly Cardiovascular: RRR, no m/r/g. No LE edema. Telemetry: SR, no arrhythmias  Respiratory: CTA bilaterally, no w/r/r. Normal respiratory effort. Abdomen: soft, ntnd Skin: no rash or induration seen on limited exam Musculoskeletal: grossly normal tone BUE/BLE Psychiatric: Not examined. Neurologic: grossly non-focal.          Labs on Admission:  Basic Metabolic Panel:  Recent Labs Lab 01/15/15 1745  NA 174*  K 3.6  CL 138*  CO2 26  GLUCOSE 118*  BUN 62*  CREATININE 1.53*  CALCIUM 10.2  Liver Function Tests: No results for input(s): AST, ALT, ALKPHOS, BILITOT, PROT, ALBUMIN in the last 168 hours. No results for input(s): LIPASE, AMYLASE in the last 168 hours. No results for input(s): AMMONIA in the last 168 hours. CBC:  Recent Labs Lab 01/15/15 1745  WBC 11.5*  NEUTROABS 7.8*  HGB 18.1*  HCT 60.1*  MCV 103.6*  PLT 99*   Cardiac Enzymes:  Recent Labs Lab 01/15/15 1745  TROPONINI <0.03    BNP (last 3 results) No results for input(s): BNP in the last 8760 hours.  ProBNP (last 3 results) No results for input(s): PROBNP in the last 8760 hours.  CBG: No results for input(s): GLUCAP in the last 168  hours.  Radiological Exams on Admission: No results found.    Assessment/Plan   1. Hypernatremia. This is secondary to severe dehydration. She will be treated with intravenous normal saline and if the sodium does not improve with this, we will need to switch to half normal saline. 2. Dehydration. IV fluids as above. Check urinalysis for possible UTI. 3. Severe dementia. This appears to be stable. I will request palliative medicine consultation to discuss goals of care with family.  She'll be admitted to the medical floor. Further recommendations will depend on patient's hospital progress.  Code Status: DO NOT RESUSCITATE. This is continued from her papers.   DVT Prophylaxis: Lovenox.  Family Communication: There are no family members available to discuss any further plans.  Disposition Plan: Discharge when she is medically stable.   Time spent: 45 minutes.  Wilson Singer [facility] Pager [phone].

## 2015-01-15 NOTE — ED Notes (Signed)
Unable to get pt to sit up for orthostatics.

## 2015-01-15 NOTE — ED Notes (Addendum)
CRITICAL VALUE ALERT  Critical value received:  Sodium 174, chloride 138  Date of notification: 01/15/2015  Time of notification:  1854  Critical value read back: yes  Nurse who received alert:  Darcella GasmanBrian Whitt Auletta  MD notified (1st page):  Bebe ShaggyWickline  Time of first page:  1855  MD notified (2nd page):  Time of second page:  Responding MD:  Bebe ShaggyWickline  Time MD responded:  [phone]

## 2015-01-15 NOTE — ED Provider Notes (Signed)
CSN: [id number]     Arrival date & time 01/15/15  1710 History   First MD Initiated Contact with Patient 01/15/15 1724     Chief Complaint  Patient presents with  . Weakness   LEVEL 5 CAVEAT DUE TO DEMENTIA  Patient is a 74 y.o. female presenting with weakness. The history is provided by the EMS personnel and the nursing home. The history is limited by the condition of the patient.  Weakness This is a new problem. Episode onset: unknown. The problem occurs constantly. The problem has been gradually worsening. Nothing aggravates the symptoms. Nothing relieves the symptoms.   Patient from nursing facility Per nursing staff at facility [facility], i spoke to her via phone) She has had decreased PO for 3 days She is drooling and not swallowing They are concerned she is dehydrated No fever/vomiting/diarrhea She has a DNR in place She is usually nonverbal and non-ambulatory at baseline Past Medical History  Diagnosis Date  . Dementia   . GERD (gastroesophageal reflux disease)   . Hypokalemia   . Hip fracture (HCC)   . Anxiety   . Depression   . Hypertension    History reviewed. No pertinent past surgical history. History reviewed. No pertinent family history. Social History  Substance Use Topics  . Smoking status: Never Smoker   . Smokeless tobacco: None  . Alcohol Use: No   OB History    No data available     Review of Systems  Unable to perform ROS: Dementia  Constitutional: Positive for unexpected weight change. Negative for fever.  Gastrointestinal: Negative for vomiting and diarrhea.  Neurological: Positive for weakness.      Allergies  Review of patient's allergies indicates no known allergies.  Home Medications   Prior to Admission medications   Medication Sig Start Date End Date Taking? Authorizing Provider  aspirin 81 MG chewable tablet Chew 81 mg by mouth daily.      Historical Provider, MD  lactulose (CHRONULAC) 10 GM/15ML solution Take 30 g by mouth every  other day.    Historical Provider, MD  polyethylene glycol (MIRALAX / GLYCOLAX) packet Take 17 g by mouth at bedtime.    Historical Provider, MD  rivastigmine (EXELON) 3 MG capsule Take 3 mg by mouth 2 (two) times daily.      Historical Provider, MD   BP 104/82 mmHg  Pulse 94  Temp(Src) 97.9 F (36.6 C) (Oral)  Resp 16  Wt 56.7 kg  SpO2 100% Physical Exam CONSTITUTIONAL: elderly and frail.  She is nonverbal.  She is lying on left side and contracted HEAD: Normocephalic/atraumatic EYES: EOMI/PERRL ENMT: Mucous membranes dry NECK: supple no meningeal signs SPINE/BACK:entire spine nontender, kyphotic spine CV: S1/S2 noted LUNGS: Lungs are clear to auscultation bilaterally ABDOMEN: soft, nontender NEURO: Pt is awake/alert.  She is nonverbal.  She will move all extremities but prefers to lay contracted on left side  EXTREMITIES: pulses normal/equal, legs are contracted, no signs of trauma to extremities SKIN: warm, color normal PSYCH: unable to assess  ED Course  Procedures  CRITICAL CARE Performed by: Joya Gaskins Total critical care time: 35 minutes Critical care time was exclusive of separately billable procedures and treating other patients. Critical care was necessary to treat or prevent imminent or life-threatening deterioration. Critical care was time spent personally by me on the following activities: development of treatment plan with patient and/or surrogate as well as nursing, discussions with consultants, evaluation of patient's response to treatment, examination of patient, obtaining history from  patient or surrogate, ordering and performing treatments and interventions, ordering and review of laboratory studies, ordering and review of radiographic studies, pulse oximetry and re-evaluation of patient's condition. PATIENT SEVERE HYPERNATREMIA WITH SODIUM >170 WILL REQUIRE ADMISSION  6:17 PM Pt with decreased PO and likely dehydrated She is currently stable but will  need IV fluids 7:44 PM Pt with severe hypernatremia Na>170 Will require admission and correction of electrolytes D/w dr Karilyn Cotagosrani, will admit Pt is a DNR Labs Review Labs Reviewed  BASIC METABOLIC PANEL - Abnormal; Notable for the following:    Sodium 174 (*)    Chloride 138 (*)    Glucose, Bld 118 (*)    BUN 62 (*)    Creatinine, Ser 1.53 (*)    GFR calc non Af Amer 32 (*)    GFR calc Af Amer 38 (*)    All other components within normal limits  CBC WITH DIFFERENTIAL/PLATELET - Abnormal; Notable for the following:    WBC 11.5 (*)    RBC 5.80 (*)    Hemoglobin 18.1 (*)    HCT 60.1 (*)    MCV 103.6 (*)    Platelets 99 (*)    Neutro Abs 7.8 (*)    All other components within normal limits  TROPONIN I  URINALYSIS, ROUTINE W REFLEX MICROSCOPIC (NOT AT [facility])    I have personally reviewed and evaluated these lab results as part of my medical decision-making.  ED ECG REPORT  EPIC link not working  Date: 01/15/2015 1754  Rate: 96  Rhythm: normal sinus rhythm  QRS Axis: normal  Intervals: normal  ST/T Wave abnormalities: nonspecific ST changes  Conduction Disutrbances:none Artifact limits evaluation  I have personally reviewed the EKG tracing and agree with the computerized printout as noted.  MDM   Final diagnoses:  Hypernatremia  Dehydration  Encephalopathy acute    Nursing notes including past medical history and social history reviewed and considered in documentation Labs/vital reviewed myself and considered during evaluation     Zadie Rhineonald Sirenia Whitis, MD 01/15/15 1945

## 2015-01-15 NOTE — ED Notes (Signed)
[place].  Pt not eating for the last day.  Not as responsive,  Weakness .

## 2015-01-16 DIAGNOSIS — N179 Acute kidney failure, unspecified: Secondary | ICD-10-CM | POA: Diagnosis present

## 2015-01-16 DIAGNOSIS — Z515 Encounter for palliative care: Secondary | ICD-10-CM | POA: Diagnosis present

## 2015-01-16 DIAGNOSIS — R627 Adult failure to thrive: Secondary | ICD-10-CM | POA: Diagnosis present

## 2015-01-16 LAB — BASIC METABOLIC PANEL
BUN: 52 mg/dL — ABNORMAL HIGH (ref 6–20)
BUN: 46 mg/dL — ABNORMAL HIGH (ref 6–20)
CALCIUM: 9.1 mg/dL (ref 8.9–10.3)
CALCIUM: 9.1 mg/dL (ref 8.9–10.3)
CO2: 26 mmol/L (ref 22–32)
CO2: 26 mmol/L (ref 22–32)
Chloride: >130 mmol/L (ref 101–111)
Chloride: >130 mmol/L (ref 101–111)
Creatinine, Ser: 1.34 mg/dL — ABNORMAL HIGH (ref 0.44–1.00)
Creatinine, Ser: 1.16 mg/dL — ABNORMAL HIGH (ref 0.44–1.00)
GFR, EST AFRICAN AMERICAN: 44 mL/min — AB (ref >60)
GFR, EST AFRICAN AMERICAN: 52 mL/min — AB (ref >60)
GFR, EST NON AFRICAN AMERICAN: 38 mL/min — AB (ref >60)
GFR, EST NON AFRICAN AMERICAN: 45 mL/min — AB (ref >60)
Glucose, Bld: 119 mg/dL — ABNORMAL HIGH (ref 65–99)
Glucose, Bld: 107 mg/dL — ABNORMAL HIGH (ref 65–99)
POTASSIUM: 3.6 mmol/L (ref 3.5–5.1)
POTASSIUM: 3.4 mmol/L — AB (ref 3.5–5.1)
SODIUM: 173 mmol/L — AB (ref 135–145)
SODIUM: 170 mmol/L — AB (ref 135–145)

## 2015-01-16 LAB — COMPREHENSIVE METABOLIC PANEL
ALK PHOS: 70 U/L (ref 38–126)
ALT: 35 U/L (ref 14–54)
AST: 38 U/L (ref 15–41)
Albumin: 3.9 g/dL (ref 3.5–5.0)
BILIRUBIN TOTAL: 1 mg/dL (ref 0.3–1.2)
BUN: 58 mg/dL — ABNORMAL HIGH (ref 6–20)
CALCIUM: 9.4 mg/dL (ref 8.9–10.3)
CO2: 26 mmol/L (ref 22–32)
CREATININE: 1.36 mg/dL — AB (ref 0.44–1.00)
GFR calc Af Amer: 43 mL/min — ABNORMAL LOW (ref >60)
GFR, EST NON AFRICAN AMERICAN: 37 mL/min — AB (ref >60)
Glucose, Bld: 111 mg/dL — ABNORMAL HIGH (ref 65–99)
Potassium: 3.7 mmol/L (ref 3.5–5.1)
Sodium: 175 mmol/L (ref 135–145)
TOTAL PROTEIN: 6.7 g/dL (ref 6.5–8.1)

## 2015-01-16 LAB — CBC
HCT: 50.8 % — ABNORMAL HIGH (ref 36.0–46.0)
Hemoglobin: 15.2 g/dL — ABNORMAL HIGH (ref 12.0–15.0)
MCH: 31 pg (ref 26.0–34.0)
MCHC: 29.9 g/dL — ABNORMAL LOW (ref 30.0–36.0)
MCV: 103.5 fL — AB (ref 78.0–100.0)
PLATELETS: 92 10*3/uL — AB (ref 150–400)
RBC: 4.91 MIL/uL (ref 3.87–5.11)
RDW: 13.9 % (ref 11.5–15.5)
WBC: 13 10*3/uL — AB (ref 4.0–10.5)

## 2015-01-16 LAB — MRSA PCR SCREENING: MRSA BY PCR: NEGATIVE

## 2015-01-16 MED ORDER — DEXTROSE 5 % IV SOLN
INTRAVENOUS | Status: DC
  Administered 2015-01-16 – 2015-01-17 (×2): via INTRAVENOUS

## 2015-01-16 NOTE — Evaluation (Signed)
Clinical/Bedside Swallow Evaluation Patient Details  Name: Jillian King MRN: [id number] Date of Birth: 1940-04-15  Today's Date: 01/16/2015 Time: SLP Start Time (ACUTE ONLY): 1452 SLP Stop Time (ACUTE ONLY): 1526 SLP Time Calculation (min) (ACUTE ONLY): 34 min  Past Medical History:  Past Medical History  Diagnosis Date  . Dementia   . GERD (gastroesophageal reflux disease)   . Hypokalemia   . Hip fracture (HCC)   . Anxiety   . Depression   . Hypertension    Past Surgical History: History reviewed. No pertinent past surgical history. HPI:  Pt is a 74 year old female, with severe dementia and is a resident of [address]. She apparently has had poor by mouth intake for the last 3-4 days, food is running out of her mouth and she is not eating. She has been more sleepy and lethargic lately in the last few days. Noted to be cachectic with acute renal failure, failure to thrive in adult, dehydration, and hypernatremia.    Assessment / Plan / Recommendation Clinical Impression  Pt presents with moderate to severe oropharyngeal dysphagia which is greatly impacted by cogntive decline. Pt appears to be severely deconditioned and cachectic with significantly advanced dementia and inability to follow commands. Pt difficult to maintain in upright positioning. Noted abnormal vertical munch-like mastication pattern with reduced cohesion and decreased timely AP transit of puree boluses. Suspect delay in swallow initiation across all consistencies given laryngeal palpation by SLP. Pt with inconsistent anterior spillage and oral residuals of puree. No solid PO attempted given marked difficulty with puree. Suspect pt to be high risk for silent aspriation given advanced cognitive decline and oral sensory deficits observed. Overt immediate and delayed weak reflexive coughing noted with thin and nectar thick trials by cup sip. Pt without overt signs of aspiration with honey thick liquids, however continues to  be high aspiration risk given multiple medical risk factors. Consulted with MD regarding intiation of PO diet despite aspiration risk. MD okay for initiation of least restrictive diet at this time: dysphagia 1 (puree) and honey thick liquids with medicines crushed in puree. ST to follow up     Aspiration Risk  Moderate aspiration risk    Diet Recommendation     Medication Administration: Crushed with puree    Other  Recommendations Oral Care Recommendations: Oral care BID Other Recommendations: Order thickener from pharmacy   Follow up Recommendations  Skilled Nursing facility;24 hour supervision/assistance    Frequency and Duration min 2x/week  1 week       Prognosis Prognosis for Safe Diet Advancement: Guarded Barriers to Reach Goals: Cognitive deficits;Severity of deficits      Swallow Study   General Date of Onset: 01/15/15 HPI: Pt is a 74 year old female, with severe dementia and is a resident of [address]. She apparently has had poor by mouth intake for the last 3-4 days, food is running out of her mouth and she is not eating. She has been more sleepy and lethargic lately in the last few days. Noted to be cachectic with acute renal failure, failure to thrive in adult, dehydration, and hypernatremia.  Type of Study: Bedside Swallow Evaluation Previous Swallow Assessment: none on file  Diet Prior to this Study: Regular;Thin liquids Temperature Spikes Noted: No Respiratory Status: Room air History of Recent Intubation: No Behavior/Cognition: Confused;Doesn't follow directions (nonverbal) Oral Cavity Assessment: Dry Oral Care Completed by SLP: Yes Oral Cavity - Dentition: Edentulous Vision: Impaired for self-feeding Self-Feeding Abilities: Total assist Patient Positioning: Upright in bed (difficult  to maintain fully upright positioning ) Baseline Vocal Quality: Low vocal intensity (limited jargon vocalizations) Volitional Cough: Cognitively unable to elicit;Weak  (reflexive severely weak cough) Volitional Swallow: Unable to elicit    Oral/Motor/Sensory Function Overall Oral Motor/Sensory Function: Generalized oral weakness   Ice Chips Ice chips: Not tested   Thin Liquid Thin Liquid: Impaired Presentation: Cup Oral Phase Impairments: Reduced labial seal;Poor awareness of bolus Oral Phase Functional Implications: Prolonged oral transit Pharyngeal  Phase Impairments: Suspected delayed Swallow;Cough - Immediate;Multiple swallows    Nectar Thick Nectar Thick Liquid: Impaired Presentation: Cup Oral Phase Impairments: Reduced labial seal;Reduced lingual movement/coordination;Poor awareness of bolus Oral phase functional implications: Prolonged oral transit Pharyngeal Phase Impairments: Suspected delayed Swallow;Multiple swallows;Cough - Delayed   Honey Thick Honey Thick Liquid: Impaired Presentation: Cup Oral Phase Impairments: Poor awareness of bolus;Reduced labial seal Oral Phase Functional Implications: Prolonged oral transit Pharyngeal Phase Impairments: Suspected delayed Swallow;Multiple swallows   Puree Puree: Impaired Presentation: Spoon Oral Phase Impairments: Impaired mastication;Poor awareness of bolus;Reduced lingual movement/coordination Oral Phase Functional Implications: Prolonged oral transit Pharyngeal Phase Impairments: Suspected delayed Swallow;Multiple swallows   Solid Solid: Not tested      Marcene Duoshelsea Sumney MA, CCC-SLP Acute Care Speech Language Pathologist    Marcene DuosSumney, Cidney Kirkwood E 01/16/2015,3:54 PM

## 2015-01-16 NOTE — Progress Notes (Signed)
Patient's blood pressure 97/58 and pulse 71.  Dr. Arthor CaptainElmahi notified via text page.

## 2015-01-16 NOTE — Progress Notes (Signed)
CRITICAL VALUE ALERT  Critical value received:  NA 175  CHL 141  Date of notification:  01/16/215  Time of notification:  0748  Critical value read back:Yes.    Nurse who received alert:  Fara ChuteMary Beth Macy Lingenfelter, RN  MD notified (1st page):  Dr. Arthor CaptainElmahi  Time of first page:  [phone]  MD notified (2nd page):  Time of second page:  Responding MD:    Time MD responded:    IV Fluids changed

## 2015-01-16 NOTE — Plan of Care (Signed)
Call to son, Jillian PattenJohn King.  He is in [place] at this time visiting in laws.  We make a plan to have a phone conversation tomorrow at 10 am.  No questions at this time.

## 2015-01-16 NOTE — Care Management Note (Signed)
Case Management Note  Patient Details  Name: Lorre MunroeClara J Abernethy MRN: [id number] Date of Birth: 07/02/1940  Subjective/Objective:                  [facility]. Pt will return to facility when medically stable.  Action/Plan: Palliative care consult pending. CSW is aware of pts admission. Will continue to follow.  Expected Discharge Date:                  Expected Discharge Plan:  Assisted Living / Rest Home  In-House Referral:  Clinical Social Work, Hospice / Palliative Care  Discharge planning Services  CM Consult  Post Acute Care Choice:    Choice offered to:     DME Arranged:    DME Agency:     HH Arranged:    HH Agency:     Status of Service:  In process, will continue to follow  Medicare Important Message Given:    Date Medicare IM Given:    Medicare IM give by:    Date Additional Medicare IM Given:    Additional Medicare Important Message give by:     If discussed at Long Length of Stay Meetings, dates discussed:    Additional Comments:  Cheryl FlashBlackwell, Aila Terra Crowder, RN 01/16/2015, 10:28 AM

## 2015-01-16 NOTE — Progress Notes (Signed)
Late entry:  Patient pocketing food in mouth while eating lunch.  Teeth missing.  Dr. Andre LefortElhami notified and speech therapy consult ordered.

## 2015-01-16 NOTE — Progress Notes (Signed)
CRITICAL VALUE ALERT  Critical value received:  Sodium 173 and Chloride 143  Date of notification:  01/16/2015  Time of notification:  1328  Critical value read back:Yes.    Nurse who received alert:  Fara ChuteMary Beth Dhilan Brauer, RN  MD notified (1st page):  Dr. Arthor CaptainElmahi  Time of first page:  1332  MD notified (2nd page):  Time of second page:  Responding MD:  Dr. Arthor CaptainElmahi  Time MD responded:  1334  Continue current orders.

## 2015-01-16 NOTE — Progress Notes (Signed)
CRITICAL VALUE ALERT  Critical value received:  Sodium 170 and Chloride >130  Date of notification: 01-16-15  Time of notification:  2115  Critical value read back: yes  Nurse who received alert:  b Milana Obeyjohnson, rn   MD notified (1st page):  Craige Cottakirby  Time of first page:  2118  MD notified (2nd page):  Time of second page:  Responding MD:  Craige Cottakirby  Time MD responded:  2120  Received order for BMET q 8 hours and to call results of sodium each time.  Will continue to monitor patient.

## 2015-01-16 NOTE — Progress Notes (Signed)
[facility] PROGRESS NOTE   Jillian King CZY:[id number] DOB: 10/19/1940 DOA: 01/15/2015 PCP: Galvin Proffer, MD  HPI/Subjective: Patient is severely demented, does not follow commands. Patient is cachectic.  Assessment/Plan: Principal Problem:   Hypernatremia Active Problems:   Dementia with behavioral disturbance   Dehydration   Acute renal failure (HCC)   FTT (failure to thrive) in adult   Hypernatremia Presented with severe hypernatremia of 173, likely secondary to severe dehydration and poor oral intake. Patient started on gentle hydration with D5W at 50 mL/Hr Correction target is not to exceed 0.5 mEq per hour for the next 48 hours. Check BMP every 8 hours.  Acute renal failure Patient presented with creatinine of 1.5, BUN is 62 with ratio suggesting dehydration. Creatinine today is 1.3 after the hydration with normal saline. Continue hydration, now with D5W, check BMP every 8 hours.  Failure to thrive Poor oral intake and failure to thrive secondary to advanced dementia. Patient has cachexia with weight of 40 and BMI around 15. Palliative care consulted.  Advanced dementia  Appears to be worsening, patient  Does not even follow commands.  Dehydration Secondary to poor oral intake, given 1 L of normal saline on admission, switched to D5W because of hypernatremia.   Code Status: DNR Family Communication: Plan discussed with the patient. Disposition Plan: Remains inpatient Diet: Diet Heart Room service appropriate?: Yes; Fluid consistency:: Thin  Consultants:  None  Procedures:  Palliative  Antibiotics:  None   Objective: Filed Vitals:   01/15/15 2209 01/16/15 0431  BP: 108/83 101/61  Pulse: 87   Temp: 97.6 F (36.4 C) 97.7 F (36.5 C)  Resp: 16 15    Intake/Output Summary (Last 24 hours) at 01/16/15 1058 Last data filed at 01/15/15 1900  Gross per 24 hour  Intake      0 ml  Output      0 ml  Net      0 ml   Filed Weights   01/15/15 1714 01/15/15 2209  Weight: 56.7 kg (125 lb) 40.415 kg (89 lb 1.6 oz)    Exam: General: Alert and awake, oriented x3, not in any acute distress. HEENT: anicteric sclera, pupils reactive to light and accommodation, EOMI CVS: S1-S2 clear, no murmur rubs or gallops Chest: clear to auscultation bilaterally, no wheezing, rales or rhonchi Abdomen: soft nontender, nondistended, normal bowel sounds, no organomegaly Extremities: no cyanosis, clubbing or edema noted bilaterally Neuro: Cranial nerves II-XII intact, no focal neurological deficits  Data Reviewed: Basic Metabolic Panel:  Recent Labs Lab 01/15/15 1745 01/16/15 0551  NA 174* 175*  K 3.6 3.7  CL 138* >130*  CO2 26 26  GLUCOSE 118* 111*  BUN 62* 58*  CREATININE 1.53* 1.36*  CALCIUM 10.2 9.4   Liver Function Tests:  Recent Labs Lab 01/16/15 0551  AST 38  ALT 35  ALKPHOS 70  BILITOT 1.0  PROT 6.7  ALBUMIN 3.9   No results for input(s): LIPASE, AMYLASE in the last 168 hours. No results for input(s): AMMONIA in the last 168 hours. CBC:  Recent Labs Lab 01/15/15 1745 01/16/15 0551  WBC 11.5* 13.0*  NEUTROABS 7.8*  --   HGB 18.1* 15.2*  HCT 60.1* 50.8*  MCV 103.6* 103.5*  PLT 99* 92*   Cardiac Enzymes:  Recent Labs Lab 01/15/15 1745  TROPONINI <0.03   BNP (last 3 results) No results for input(s): BNP in the last 8760 hours.  ProBNP (last 3 results) No results for input(s): PROBNP in the last  8760 hours.  CBG: No results for input(s): GLUCAP in the last 168 hours.  Micro Recent Results (from the past 240 hour(s))  MRSA PCR Screening     Status: None   Collection Time: 01/16/15  2:40 AM  Result Value Ref Range Status   MRSA by PCR NEGATIVE NEGATIVE Final    Comment:        The GeneXpert MRSA Assay (FDA approved for NASAL specimens only), is one component of a comprehensive MRSA colonization surveillance program. It is not intended to diagnose MRSA infection nor to guide or monitor  treatment for MRSA infections.      Studies: No results found.  Scheduled Meds: . aspirin  81 mg Oral Daily  . lactulose  30 g Oral QODAY   Continuous Infusions: . dextrose 50 mL/hr at 01/16/15 0849       Time spent: 35 minutes    [facility] For ChildrenELMAHI,Remigio Mcmillon A  [facility] Pager [phone] If 7PM-7AM, please contact night-coverage at [email or web address], password [email or web address] 01/16/2015, 10:58 AM  LOS: 1 day

## 2015-01-17 DIAGNOSIS — R64 Cachexia: Secondary | ICD-10-CM

## 2015-01-17 DIAGNOSIS — E860 Dehydration: Secondary | ICD-10-CM

## 2015-01-17 DIAGNOSIS — E870 Hyperosmolality and hypernatremia: Secondary | ICD-10-CM

## 2015-01-17 DIAGNOSIS — F0391 Unspecified dementia with behavioral disturbance: Secondary | ICD-10-CM

## 2015-01-17 DIAGNOSIS — R627 Adult failure to thrive: Secondary | ICD-10-CM

## 2015-01-17 LAB — BASIC METABOLIC PANEL
ANION GAP: 0 — AB (ref 5–15)
ANION GAP: 9 (ref 5–15)
BUN: 40 mg/dL — ABNORMAL HIGH (ref 6–20)
BUN: 35 mg/dL — ABNORMAL HIGH (ref 6–20)
BUN: 31 mg/dL — ABNORMAL HIGH (ref 6–20)
CALCIUM: 9 mg/dL (ref 8.9–10.3)
CALCIUM: 8.6 mg/dL — AB (ref 8.9–10.3)
CHLORIDE: 132 mmol/L — AB (ref 101–111)
CO2: 29 mmol/L (ref 22–32)
CO2: 26 mmol/L (ref 22–32)
CO2: 26 mmol/L (ref 22–32)
Calcium: 9.4 mg/dL (ref 8.9–10.3)
Chloride: >130 mmol/L (ref 101–111)
Chloride: 139 mmol/L (ref 101–111)
Creatinine, Ser: 1.1 mg/dL — ABNORMAL HIGH (ref 0.44–1.00)
Creatinine, Ser: 1.07 mg/dL — ABNORMAL HIGH (ref 0.44–1.00)
Creatinine, Ser: 0.87 mg/dL (ref 0.44–1.00)
GFR calc Af Amer: 58 mL/min — ABNORMAL LOW (ref >60)
GFR calc Af Amer: >60 mL/min (ref >60)
GFR calc non Af Amer: 48 mL/min — ABNORMAL LOW (ref >60)
GFR calc non Af Amer: 50 mL/min — ABNORMAL LOW (ref >60)
GFR calc non Af Amer: >60 mL/min (ref >60)
GFR, EST AFRICAN AMERICAN: 56 mL/min — AB (ref >60)
GLUCOSE: 120 mg/dL — AB (ref 65–99)
GLUCOSE: 101 mg/dL — AB (ref 65–99)
GLUCOSE: 121 mg/dL — AB (ref 65–99)
POTASSIUM: 3.7 mmol/L (ref 3.5–5.1)
Potassium: 3.8 mmol/L (ref 3.5–5.1)
Potassium: 3.2 mmol/L — ABNORMAL LOW (ref 3.5–5.1)
Sodium: 170 mmol/L (ref 135–145)
Sodium: 165 mmol/L (ref 135–145)
Sodium: 167 mmol/L (ref 135–145)

## 2015-01-17 MED ORDER — FREE WATER
250.0000 mL | Freq: Four times a day (QID) | Status: DC
  Administered 2015-01-17 – 2015-01-18 (×5): 250 mL via ORAL

## 2015-01-17 NOTE — Plan of Care (Signed)
Call from Jillian King's son Jillian King. He tells me that he and his family are vacationing in [place] and wants to know if he should come home quickly.  I share that I do not feel that Jillian King is actively dying and they have time.  He tells me they will return over the weekend.   Call to son, Jillian King.  We plan to meet at bedside tomorrow at 0830-0900.  He tells me that he is still wanting Hospice Home in [place].  Hospice provider, RN Selena BattenKim, notified of time for visit. (clinical information via 'the hub' tomorrow).

## 2015-01-17 NOTE — Consult Note (Signed)
Consultation Note Date: 01/17/2015   Patient Name: Jillian King  DOB: 29-Feb-1940  MRN: [id number]  Age / Sex: 74 y.o., female  PCP: Shelbie Ammons, MD Referring Physician: Eddie North, MD  Reason for Consultation: Establishing goals of care, Inpatient hospice referral and Psychosocial/spiritual support    Clinical Assessment/Narrative: Jillian King is lying in bed with her legs pulled up to her chest. She does not look at me or interact with me.  She does open her mouth when I offer a spoonful of thickened liquid, but she will not take a second spoonful.     Call to son, Orlean Patten, currently visiting in laws in [place], but returning to [place] today.  We talk about Jillian King life over the last few years, and he shares that she has live at [facility] for the last 4 years, has not fed herself for the last 2-3 years, and it's "been awhile" since she last walked.   We talk about HCPOA and AD.   John shares that we are to not attempt resuscitation if Jillian King is to decline.  We talk about PEG tube placement (not recommended) and John also states he would not want this for her.    We talk about her labs not improving, her decreased intake and lethargy.  I share my worry that Jillian King end time is near, [name] states "I knew a couple of months ago".  We talk about where she would be most comfortable, and [name] shares that there is turnover at [facility] and he doesn't feel that she would benefit from returning.  We talk about the Hospice home in [place] and [name] agrees that this will afford Jillian King comfort and dignity at this time.   [name] shares that he will be at the hospital around 6 pm tonight.  We talk about meeting tomorrow, but there is no set time.  We will discuss full comfort care and transition to hospice in patient when we meet face to face.   Contacts/Participants in Discussion: Primary Decision  Maker: Jillian King is unable to make her own decisions d/t advanced dementia.    Relationship to Patient Son, Orlean Patten is HCPOA.   HCPOA: yes  Daughter in [place], and son in [place] of [place] (per [name]).  They do not participate in [facility] care.   SUMMARY OF RECOMMENDATIONS  Code Status/Advance Care Planning: DNR    Code Status Orders        Start     Ordered   01/15/15 1949  Do not attempt resuscitation (DNR)   Continuous    Question Answer Comment  In the event of cardiac or respiratory ARREST Do not call a "code blue"   In the event of cardiac or respiratory ARREST Do not perform Intubation, CPR, defibrillation or ACLS   In the event of cardiac or respiratory ARREST Use medication by any route, position, wound care, and other measures to relive pain and suffering. May use oxygen, suction and manual treatment of airway obstruction as needed for comfort.      01/15/15 1948    Advance Directive Documentation        Most Recent Value   Type of Advance Directive  Out of facility DNR (pink MOST or yellow form)   Pre-existing out of facility DNR order (yellow form or pink MOST form)  Yellow form placed in chart (order not valid for inpatient use)   "MOST" Form in Place?  Other Directives:None  Symptom Management:   Zofran 4 mg PO/IV Q 6 hours PRN.   Palliative Prophylaxis:   Oral Care and Turn Reposition  Additional Recommendations (Limitations, Scope, Preferences):  Son, [name] will be coming later today to see his mother.  He is open to inpatient Hospice at [facility].  We need to meet face to face to talk about full comfort care.   Psycho-social/Spiritual:  Support System: Fair, Lives in [facility] for last 4 years.  Desire for further Chaplaincy support:  Not discussed today.  Additional Recommendations: Caregiving  Support/Resources  Prognosis: < 4 weeks likely, dt end stage dementia.   Discharge Planning: Hospice facility [place], [place].     Chief Complaint/ Primary Diagnoses: Present on Admission:  . Hypernatremia . Dehydration . Dementia with behavioral disturbance . Acute renal failure (HCC) . FTT (failure to thrive) in adult  I have reviewed the medical record, interviewed the patient and family, and examined the patient. The following aspects are pertinent.  Past Medical History  Diagnosis Date  . Dementia   . GERD (gastroesophageal reflux disease)   . Hypokalemia   . Hip fracture (HCC)   . Anxiety   . Depression   . Hypertension    Social History   Social History  . Marital Status: Married    Spouse Name: N/A  . Number of Children: N/A  . Years of Education: N/A   Social History Main Topics  . Smoking status: Never Smoker   . Smokeless tobacco: None  . Alcohol Use: No  . Drug Use: No  . Sexual Activity: Not Asked   Other Topics Concern  . None   Social History Narrative   History reviewed. No pertinent family history. Scheduled Meds: . aspirin  81 mg Oral Daily  . free water  250 mL Oral Q6H  . lactulose  30 g Oral QODAY   Continuous Infusions: . dextrose 75 mL/hr at 01/17/15 0802   PRN Meds:.ondansetron **OR** ondansetron (ZOFRAN) IV Medications Prior to Admission:  Prior to Admission medications   Medication Sig Start Date End Date Taking? Authorizing Provider  aspirin 81 MG chewable tablet Chew 81 mg by mouth daily.     Yes Historical Provider, MD  lactulose (CHRONULAC) 10 GM/15ML solution Take 30 g by mouth every other day.   Yes Historical Provider, MD  Nutritional Supplements (NUTRITIONAL SHAKE PO) Take 1 Can by mouth 2 (two) times daily.   Yes Historical Provider, MD  polyethylene glycol (MIRALAX / GLYCOLAX) packet Take 17 g by mouth at bedtime.   Yes Historical Provider, MD  rivastigmine (EXELON) 3 MG capsule Take 3 mg by mouth 2 (two) times daily.     Yes Historical Provider, MD   No Known Allergies  Review of Systems  Unable to perform ROS All other systems reviewed and  are negative.   Physical Exam  Constitutional:  Thin, frail, does not make eye contact.   HENT:  Head: Normocephalic and atraumatic.  Cardiovascular: Normal rate and regular rhythm.   Respiratory: Effort normal and breath sounds normal. No respiratory distress.  GI: Soft. Bowel sounds are normal. She exhibits no distension. There is no guarding.  Skin: Skin is warm and dry.    Vital Signs: BP 101/50 mmHg  Pulse 58  Temp(Src) 98.4 F (36.9 C) (Oral)  Resp 20  Wt 40.415 kg (89 lb 1.6 oz)  SpO2 100%  SpO2: SpO2: 100 % O2 Device:SpO2: 100 % O2 Flow Rate: .   IO: Intake/output  summary:  Intake/Output Summary (Last 24 hours) at 01/17/15 1252 Last data filed at 01/17/15 0900  Gross per 24 hour  Intake     30 ml  Output      0 ml  Net     30 ml    LBM: Last BM Date:  (unable to assess) Baseline Weight: Weight: 56.7 kg (125 lb) Most recent weight: Weight: 40.415 kg (89 lb 1.6 oz)      Palliative Assessment/Data:  Flowsheet Rows        Most Recent Value   Intake Tab    Referral Department  Hospitalist   Unit at Time of Referral  Med/Surg Unit   Palliative Care Primary Diagnosis  Other (Comment)   Date Notified  01/15/15   Palliative Care Type  New Palliative care   Reason for referral  Clarify Goals of Care, Psychosocial or Spiritual support, Advance Care Planning   Date of Admission  01/15/15   Date first seen by Palliative Care  01/16/15   # of days Palliative referral response time  1 Day(s)   # of days IP prior to Palliative referral  0   Clinical Assessment    Palliative Performance Scale Score  20%   Pain Max last 24 hours  Not able to report   Pain Min Last 24 hours  Not able to report   Dyspnea Max Last 24 Hours  Not able to report   Dyspnea Min Last 24 hours  Not able to report   Psychosocial & Spiritual Assessment    Palliative Care Outcomes    Patient/Family meeting held?  No   Palliative Care follow-up planned  Yes, Facility      Additional Data  Reviewed:  CBC:    Component Value Date/Time   WBC 13.0* 01/16/2015 0551   HGB 15.2* 01/16/2015 0551   HCT 50.8* 01/16/2015 0551   PLT 92* 01/16/2015 0551   MCV 103.5* 01/16/2015 0551   NEUTROABS 7.8* 01/15/2015 1745   LYMPHSABS 2.7 01/15/2015 1745   MONOABS 0.9 01/15/2015 1745   EOSABS 0.1 01/15/2015 1745   BASOSABS 0.1 01/15/2015 1745   Comprehensive Metabolic Panel:    Component Value Date/Time   NA 170* 01/17/2015 0615   K 3.8 01/17/2015 0615   CL >130* 01/17/2015 0615   CO2 29 01/17/2015 0615   BUN 40* 01/17/2015 0615   CREATININE 1.10* 01/17/2015 0615   GLUCOSE 120* 01/17/2015 0615   CALCIUM 9.0 01/17/2015 0615   AST 38 01/16/2015 0551   ALT 35 01/16/2015 0551   ALKPHOS 70 01/16/2015 0551   BILITOT 1.0 01/16/2015 0551   PROT 6.7 01/16/2015 0551   ALBUMIN 3.9 01/16/2015 0551     Time In: 1000 Time Out: 1050 Time Total: 50 minutes Greater than 50%  of this time was spent counseling and coordinating care related to the above assessment and plan. Plan of care discussed with nursing staff, CM, SW, and Dr. Gonzella Lexhungel.   Signed by: Katheran Aweove,Zierra Laroque A, NP  Katheran Aweasha A Jariel Drost, NP  01/17/2015, 12:52 PM  Please contact Palliative Medicine Team phone at [phone] for questions and concerns.

## 2015-01-17 NOTE — Progress Notes (Signed)
Critical lab Na 165, Chloride 139. Dr. Gonzella Lexhungel notified.

## 2015-01-17 NOTE — Clinical Social Work Note (Addendum)
CSW [facility] due to Hospice not being on the [employer] [facility].    Annice NeedySettle, Marcell Chavarin D, [place] [phone]

## 2015-01-17 NOTE — Progress Notes (Signed)
Critical lab Na 170, Chloride 137. Dr. Gonzella Lexhungel notified.

## 2015-01-17 NOTE — Progress Notes (Signed)
[facility] PROGRESS NOTE  BONNI NEUSER EAV:[id number] DOB: 13-Apr-1940 DOA: 01/15/2015 PCP: Galvin Proffer, MD  Brief narrative Severely demented 74 year old female resident of nursing home with history of hypertension, anxiety, depression and GERD with poor by mouth intake for the last 3-4 days, increasingly sleepy and lethargic.  In the ED she appeared severely dehydrated with sodium of 174 and acute kidney injury . patient found to have severe failure to thrive. Admitted to hospitalist service.  Assessment/Plan:  severe dehydration with hyponatremia Patient receiving D5 water with slow improvement. Mentation has not improved. She is extremely malnourished and contracted. Added free water. Monitor sodium level closely.  Failure to thigh with severe malnutrition Poor by mouth intake with advanced dementia. Patient is extremely cachectic. Palliative Care consulted. Overall prognosis is very poor and we recommend hospice. Palliative care was discussed with patient's son on the phone this morning.  Acute kidney injury Secondary to dehydration. Continue to monitor.  Advanced severe dementia Worsening. Patient is nonverbal and does not communicate.  Diet: Dysphagia level I if tolerated.,  Code Status: DO NOT RESUSCITATE Family Communication: Will discuss with son Disposition Plan: Prognosis Is very poor. May need hospice.   Consultants:  Palliative care  Procedures:  None  Antibiotics:  None  HPI/Subjective: Seen and examined. Patient very demented.  Objective: Filed Vitals:   01/16/15 2219 01/17/15 0456  BP: 109/56 101/50  Pulse: 117 58  Temp: 98.5 F (36.9 C) 98.4 F (36.9 C)  Resp: 16 20    Intake/Output Summary (Last 24 hours) at 01/17/15 1014 Last data filed at 01/17/15 0900  Gross per 24 hour  Intake     30 ml  Output      0 ml  Net     30 ml   Filed Weights   01/15/15 1714 01/15/15 2209  Weight: 56.7 kg (125 lb) 40.415 kg (89 lb 1.6 oz)     Exam:   General:  Elderly cachectic female poorly responsive, severely demented  HEENT: Dry mucosa  Chest: Clear bilaterally  CVS: Normal S1-S2, no murmurs  GI: Soft, nondistended, nontender  Musculoskeletal: Contracted  CNS: Severely Demented,   Data Reviewed: Basic Metabolic Panel:  Recent Labs Lab 01/15/15 1745 01/16/15 0551 01/16/15 1303 01/16/15 2036 01/17/15 0615  NA 174* 175* 173* 170* 170*  K 3.6 3.7 3.6 3.4* 3.8  CL 138* >130* >130* >130* >130*  CO2 GLUCOSE 118* 111* 119* 107* 120*  BUN 62* 58* 52* 46* 40*  CREATININE 1.53* 1.36* 1.34* 1.16* 1.10*  CALCIUM 10.2 9.4 9.1 9.1 9.0   Liver Function Tests:  Recent Labs Lab 01/16/15 0551  AST 38  ALT 35  ALKPHOS 70  BILITOT 1.0  PROT 6.7  ALBUMIN 3.9   No results for input(s): LIPASE, AMYLASE in the last 168 hours. No results for input(s): AMMONIA in the last 168 hours. CBC:  Recent Labs Lab 01/15/15 1745 01/16/15 0551  WBC 11.5* 13.0*  NEUTROABS 7.8*  --   HGB 18.1* 15.2*  HCT 60.1* 50.8*  MCV 103.6* 103.5*  PLT 99* 92*   Cardiac Enzymes:  Recent Labs Lab 01/15/15 1745  TROPONINI <0.03   BNP (last 3 results) No results for input(s): BNP in the last 8760 hours.  ProBNP (last 3 results) No results for input(s): PROBNP in the last 8760 hours.  CBG: No results for input(s): GLUCAP in the last 168 hours.  Recent Results (from the past 240 hour(s))  MRSA PCR Screening  Status: None   Collection Time: 01/16/15  2:40 AM  Result Value Ref Range Status   MRSA by PCR NEGATIVE NEGATIVE Final    Comment:        The GeneXpert MRSA Assay (FDA approved for NASAL specimens only), is one component of a comprehensive MRSA colonization surveillance program. It is not intended to diagnose MRSA infection nor to guide or monitor treatment for MRSA infections.      Studies: No results found.  Scheduled Meds: . aspirin  81 mg Oral Daily  . free water  250 mL Oral  Q6H  . lactulose  30 g Oral QODAY   Continuous Infusions: . dextrose 75 mL/hr at 01/17/15 0802      Time spent: 25 minutes    Eddie NorthDHUNGEL, Roselle Norton  [facility] Pager [phone] If 7PM-7AM, please contact night-coverage at [email or web address], password [email or web address] 01/17/2015, 10:14 AM  LOS: 2 days

## 2015-01-18 DIAGNOSIS — Z515 Encounter for palliative care: Secondary | ICD-10-CM

## 2015-01-18 DIAGNOSIS — N179 Acute kidney failure, unspecified: Principal | ICD-10-CM

## 2015-01-18 LAB — BASIC METABOLIC PANEL
BUN: 26 mg/dL — ABNORMAL HIGH (ref 6–20)
CALCIUM: 8.9 mg/dL (ref 8.9–10.3)
CO2: 28 mmol/L (ref 22–32)
Chloride: >130 mmol/L (ref 101–111)
Creatinine, Ser: 1.02 mg/dL — ABNORMAL HIGH (ref 0.44–1.00)
GFR, EST NON AFRICAN AMERICAN: 53 mL/min — AB (ref >60)
GLUCOSE: 126 mg/dL — AB (ref 65–99)
Potassium: 3.5 mmol/L (ref 3.5–5.1)
SODIUM: 162 mmol/L — AB (ref 135–145)

## 2015-01-18 MED ORDER — MORPHINE SULFATE (CONCENTRATE) 10 MG/0.5ML PO SOLN
1.2500 mg | ORAL | Status: DC | PRN
  Administered 2015-01-18: 1.2 mg via ORAL
  Filled 2015-01-18: qty 0.5

## 2015-01-18 NOTE — Care Management Note (Signed)
Case Management Note  Patient Details  Name: Jillian MunroeClara J King MRN: [id number] Date of Birth: 09/11/1940  Subjective/Objective:                    Action/Plan:   Expected Discharge Date:                  Expected Discharge Plan:  Hospice Medical Facility  In-House Referral:  Clinical Social Work, Hospice / Palliative Care  Discharge planning Services  CM Consult  Post Acute Care Choice:  Hospice Choice offered to:  Adult Children  DME Arranged:    DME Agency:     HH Arranged:    HH Agency:     Status of Service:  Completed, signed off  Medicare Important Message Given:  Yes Date Medicare IM Given:    Medicare IM give by:    Date Additional Medicare IM Given:    Additional Medicare Important Message give by:     If discussed at Long Length of Stay Meetings, dates discussed:    Additional Comments: [facility]. CSW to arrange discharge to facility. Arlyss QueenBlackwell, Jessup Ogas [place], RN 01/18/2015, 10:02 AM

## 2015-01-18 NOTE — Clinical Social Work Note (Signed)
[facility] and bed [facility] available [facility]. Son notified and is on way to complete paperwork there. CSW will arranged transport via [facility]. D/C summary faxed.   Derenda FennelKara Anastasha Ortez, LCSW [phone]

## 2015-01-18 NOTE — Discharge Summary (Signed)
Physician Discharge Summary  Jillian MunroeClara J King ZOX:[id number] DOB: 12/29/1940 DOA: 01/15/2015  PCP: Galvin ProfferHAGUE, IMRAN P, MD  Admit date: 01/15/2015 Discharge date: 01/18/2015  Time spent: 30 minutes  Recommendations for Outpatient Follow-up:  Discharge to residential hospice. Prognosis is poor.   Discharge Diagnoses:  Principal Problem:   Hypernatremia   Active Problems:   Dehydration   Acute renal failure (HCC)   Dementia with behavioral disturbance   FTT (failure to thrive) in adult   Palliative care encounter   Discharge Condition: Guarded  Diet recommendation:  Comfort feeds if tolerated  CODE STATUS: DO NOT RESUSCITATE, full comfort  Filed Weights   01/15/15 1714 01/15/15 2209  Weight: 56.7 kg (125 lb) 40.415 kg (89 lb 1.6 oz)    History of present illness:  74 year old severely demented female who is a resident of nursing home with history of hypertension, anxiety, depression and GERD with poor by mouth intake for the last 3-4 days, increasingly sleepy and lethargic. In the ED she appeared severely dehydrated with sodium of 174 and acute kidney injury . patient found to have severe failure to thrive. Admitted to hospitalist service.  Hospital Course:  severe dehydration with hyponatremia Patient receiving D5 water with slow improvement. Mentation has not improved. She is extremely malnourished and contracted. Added free water. Prognosis is very poor.  Failure to thrive with severe malnutrition Poor by mouth intake with advanced dementia. Patient is extremely cachectic. Palliative Care consulted. Overall prognosis is very poor and we recommend hospice. Palliative care was discussed with patient's son who agreed for discharging her to hospice home.  Acute kidney injury Secondary to severe dehydration.   Advanced severe dementia Worsening. Patient is nonverbal and does not communicate.     Family Communication: son informed Disposition Plan: hospice  home.   Consultants:  Palliative care  Procedures:  None  Antibiotics:  None   Discharge Exam: Filed Vitals:   01/17/15 1352 01/18/15 0605  BP: 118/66 110/46  Pulse: 59 82  Temp: 98.3 F (36.8 C) 98.5 F (36.9 C)  Resp: 18 15    General: Elderly cachectic female poorly responsive, severely demented  HEENT: Dry mucosa  Chest: Clear bilaterally  CVS: Normal S1-S2, no murmurs  GI: Soft, nondistended, nontender  Musculoskeletal: Contracted  CNS: Severely Demented, nonverbal  Discharge Instructions    Current Discharge Medication List    STOP taking these medications     aspirin 81 MG chewable tablet      lactulose (CHRONULAC) 10 GM/15ML solution      Nutritional Supplements (NUTRITIONAL SHAKE PO)      polyethylene glycol (MIRALAX / GLYCOLAX) packet      rivastigmine (EXELON) 3 MG capsule        No Known Allergies Follow-up Information    Please follow up.   Why:  residential hospice       The results of significant diagnostics from this hospitalization (including imaging, microbiology, ancillary and laboratory) are listed below for reference.    Significant Diagnostic Studies: No results found.  Microbiology: Recent Results (from the past 240 hour(s))  MRSA PCR Screening     Status: None   Collection Time: 01/16/15  2:40 AM  Result Value Ref Range Status   MRSA by PCR NEGATIVE NEGATIVE Final    Comment:        The GeneXpert MRSA Assay (FDA approved for NASAL specimens only), is one component of a comprehensive MRSA colonization surveillance program. It is not intended to diagnose MRSA infection nor to  guide or monitor treatment for MRSA infections.      Labs: Basic Metabolic Panel:  Recent Labs Lab 01/16/15 2036 01/17/15 0615 01/17/15 1338 01/17/15 2055 01/18/15 0629  NA 170* 170* 165* 167* 162*  K 3.4* 3.8 3.2* 3.7 3.5  CL >130* >130* 139* 132* >130*  CO2 GLUCOSE 107* 120* 101* 121* 126*  BUN 46*  40* 35* 31* 26*  CREATININE 1.16* 1.10* 1.07* 0.87 1.02*  CALCIUM 9.1 9.0 8.6* 9.4 8.9   Liver Function Tests:  Recent Labs Lab 01/16/15 0551  AST 38  ALT 35  ALKPHOS 70  BILITOT 1.0  PROT 6.7  ALBUMIN 3.9   No results for input(s): LIPASE, AMYLASE in the last 168 hours. No results for input(s): AMMONIA in the last 168 hours. CBC:  Recent Labs Lab 01/15/15 1745 01/16/15 0551  WBC 11.5* 13.0*  NEUTROABS 7.8*  --   HGB 18.1* 15.2*  HCT 60.1* 50.8*  MCV 103.6* 103.5*  PLT 99* 92*   Cardiac Enzymes:  Recent Labs Lab 01/15/15 1745  TROPONINI <0.03   BNP: BNP (last 3 results) No results for input(s): BNP in the last 8760 hours.  ProBNP (last 3 results) No results for input(s): PROBNP in the last 8760 hours.  CBG: No results for input(s): GLUCAP in the last 168 hours.     Signed:  Eddie North MD  FACP  [facility] 01/18/2015, 9:22 AM

## 2015-01-18 NOTE — Progress Notes (Signed)
Report given to Hansel StarlingKim Bennett at [facility] for pt[facility]s transfer.

## 2015-01-18 NOTE — Care Management Important Message (Signed)
Important Message  Patient Details  Name: Jillian King MRN: [id number] Date of Birth: 08/28/1940   Medicare Important Message Given:  Yes    Cheryl FlashBlackwell, Sherah Lund Crowder, RN 01/18/2015, 10:01 AM

## 2015-01-18 NOTE — Progress Notes (Signed)
Daily Progress Note   Patient Name: Jillian MunroeClara J King       Date: 01/18/2015 DOB: 05/05/1940  Age: 74 y.o. MRN#: [id number] Attending Physician: Eddie NorthNishant Dhungel, MD Primary Care Physician: Galvin ProfferHAGUE, IMRAN P, MD Admit Date: 01/15/2015  Reason for Consultation/Follow-up: Hospice Evaluation, Inpatient hospice referral, Pain control, Psychosocial/spiritual support and Terminal Care  Subjective: Jillian King is resting quietly in bed with her son, Jonny RuizJohn (HCPOA) and DIL Misty StanleyLisa at her bedside.  She does not speak but has a large smile when Misty StanleyLisa talks to her and smiles.   We go to my office to meet.  We call brother Nida BoatmanBrad, in [place], who participates via phone.   We talk about the dementia chronic illness trajectory, including mobility, nutritional, and risk for infections.  Jillian King has had a relatively stable course, being unable to self feed over the last 2 years.  John and Misty StanleyLisa share the story of her decline, hip fracture, and eventual SNF placement.  Jonny RuizJohn and [place] agree that there has been a marked decline since Thanksgiving this year.   Jonny RuizJohn tells me that he would like [facility] [place] for his mothers final days.  We talk about comfort measures and dignity.  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 They share that she loves vanilla ice cream with maple syrup.  She will be transferred to Hospice home today.   Length of Stay: 3 days  Current Medications: Scheduled Meds:   None  Continuous Infusions:  None  PRN Meds: morphine CONCENTRATE  Physical Exam: Physical Exam  Constitutional:  Thin, temporal wasting  Pulmonary/Chest: Effort normal.  Abdominal: Soft.  Skin: Skin is warm and dry.  Psychiatric:  Severely demented,  unable to speak.   Nursing note and vitals reviewed.               Vital Signs: BP 110/46 mmHg  Pulse 82  Temp(Src) 98.5 F (36.9 C) (Axillary)  Resp 15  Wt 40.415 kg (89 lb 1.6 oz)  SpO2 100% SpO2: SpO2: 100 % O2 Device: O2 Device: Not Delivered O2 Flow Rate:    Intake/output summary:  Intake/Output Summary (Last 24 hours) at 01/18/15 1010 Last data filed at 01/18/15 0800  Gross per 24 hour  Intake    100 ml  Output      0 ml  Net    100 ml   LBM: Last BM Date:  (unable to assess) Baseline Weight: Weight: 56.7 kg (125 lb) Most recent weight: Weight: 40.415 kg (89 lb 1.6 oz)       Palliative Assessment/Data: Flowsheet Rows        Most Recent Value   Intake Tab    Referral Department  Hospitalist   Unit at Time of Referral  Med/Surg Unit   Palliative Care Primary Diagnosis  Other (Comment)   Date Notified  01/15/15   Palliative Care Type  New Palliative care   Reason for referral  Clarify Goals of Care, Psychosocial or Spiritual support, Advance Care Planning   Date of Admission  01/15/15   Date first seen by Palliative Care  01/16/15   # of days Palliative referral response time  1 Day(s)   # of days IP prior to Palliative referral  0   Clinical Assessment    Palliative Performance Scale Score  20%   Pain Max last 24 hours  Not able to report   Pain Min Last 24 hours  Not able to report   Dyspnea Max Last 24 Hours  Not able to report   Dyspnea Min Last 24 hours  Not able to report   Psychosocial & Spiritual Assessment    Palliative Care Outcomes    Patient/Family meeting held?  No   Palliative Care follow-up planned  Yes, Facility      Additional Data Reviewed: CBC    Component Value Date/Time   WBC 13.0* 01/16/2015 0551   RBC 4.91 01/16/2015 0551   HGB 15.2* 01/16/2015 0551   HCT 50.8* 01/16/2015 0551   PLT 92* 01/16/2015 0551   MCV 103.5* 01/16/2015 0551   MCH 31.0 01/16/2015 0551   MCHC 29.9* 01/16/2015 0551   RDW 13.9 01/16/2015 0551   LYMPHSABS  2.7 01/15/2015 1745   MONOABS 0.9 01/15/2015 1745   EOSABS 0.1 01/15/2015 1745   BASOSABS 0.1 01/15/2015 1745    CMP     Component Value Date/Time   NA 162* 01/18/2015 0629   K 3.5 01/18/2015 0629   CL >130* 01/18/2015 0629   CO2 28 01/18/2015 0629   GLUCOSE 126* 01/18/2015 0629   BUN 26* 01/18/2015 0629   CREATININE 1.02* 01/18/2015 0629   CALCIUM 8.9 01/18/2015 0629   PROT 6.7 01/16/2015 0551   ALBUMIN 3.9 01/16/2015 0551   AST 38 01/16/2015 0551   ALT 35 01/16/2015 0551   ALKPHOS 70 01/16/2015 0551   BILITOT 1.0 01/16/2015 0551   GFRNONAA 53* 01/18/2015 0629   GFRAA >60 01/18/2015 0629       Problem List:  Patient Active Problem List   Diagnosis Date Noted  . Acute renal failure (HCC) 01/16/2015  . FTT (failure to thrive) in adult 01/16/2015  . Palliative care encounter   . Hypernatremia 12/30/2011  . Dementia with behavioral disturbance 12/30/2011  . Dehydration 12/30/2011  . UTI (urinary tract infection) 12/30/2011  . GERD (gastroesophageal reflux disease) 12/30/2011     Palliative Care Assessment & Plan    1.Code Status:  DNR    Code Status Orders        Start     Ordered   01/15/15 1949  Do not attempt resuscitation (DNR)   Continuous    Question Answer Comment  In the event of cardiac or respiratory ARREST Do not call a "code  blue"   In the event of cardiac or respiratory ARREST Do not perform Intubation, CPR, defibrillation or ACLS   In the event of cardiac or respiratory ARREST Use medication by any route, position, wound care, and other measures to relive pain and suffering. May use oxygen, suction and manual treatment of airway obstruction as needed for comfort.      01/15/15 1948    Advance Directive Documentation        Most Recent Value   Type of Advance Directive  Out of facility DNR (pink MOST or yellow form)   Pre-existing out of facility DNR order (yellow form or pink MOST form)  Yellow form placed in chart (order not valid for  inpatient use)   "MOST" Form in Place?         2. Goals of Care/Additional Recommendations:  Full comfort care today.   Limitations on Scope of Treatment: Full Comfort Care  Desire for further Chaplaincy support:no  Psycho-social Needs: Caregiving  Support/Resources and Grief/Bereavement Support  3. Symptom Management:      1.Morphine 1.25 mg PO Q 2 hours PRN.  Per son Nida Boatman, Mrs. Dickard is very sensitive to pain medications.  He would like to have a trial of small dose of Morphine.   4. Palliative Prophylaxis:   Oral Care and Turn Reposition  5. Prognosis: < 2 weeks  6. Discharge Planning:  Hospice facility   Care plan was discussed with nursing staff, CM, SW, and Dr. Gonzella Lex  Thank you for allowing the Palliative Medicine Team to assist in the care of this patient.   Time In: 0850 Time Out: 1000 Total Time 70 minutes Prolonged Time Billed  yes         Katheran Awe, NP  01/18/2015, 10:10 AM  Please contact Palliative Medicine Team phone at [phone] for questions and concerns.

## 2015-02-21 DEATH — deceased

## 2015-04-14 IMAGING — CR DG CHEST 1V
2 series · 2 of 2 positions shown · non-contrast
Comparison: None.

CLINICAL DATA: Seizure.

EXAM:
CHEST - 1 VIEW

[view not recorded (1 of 2)]
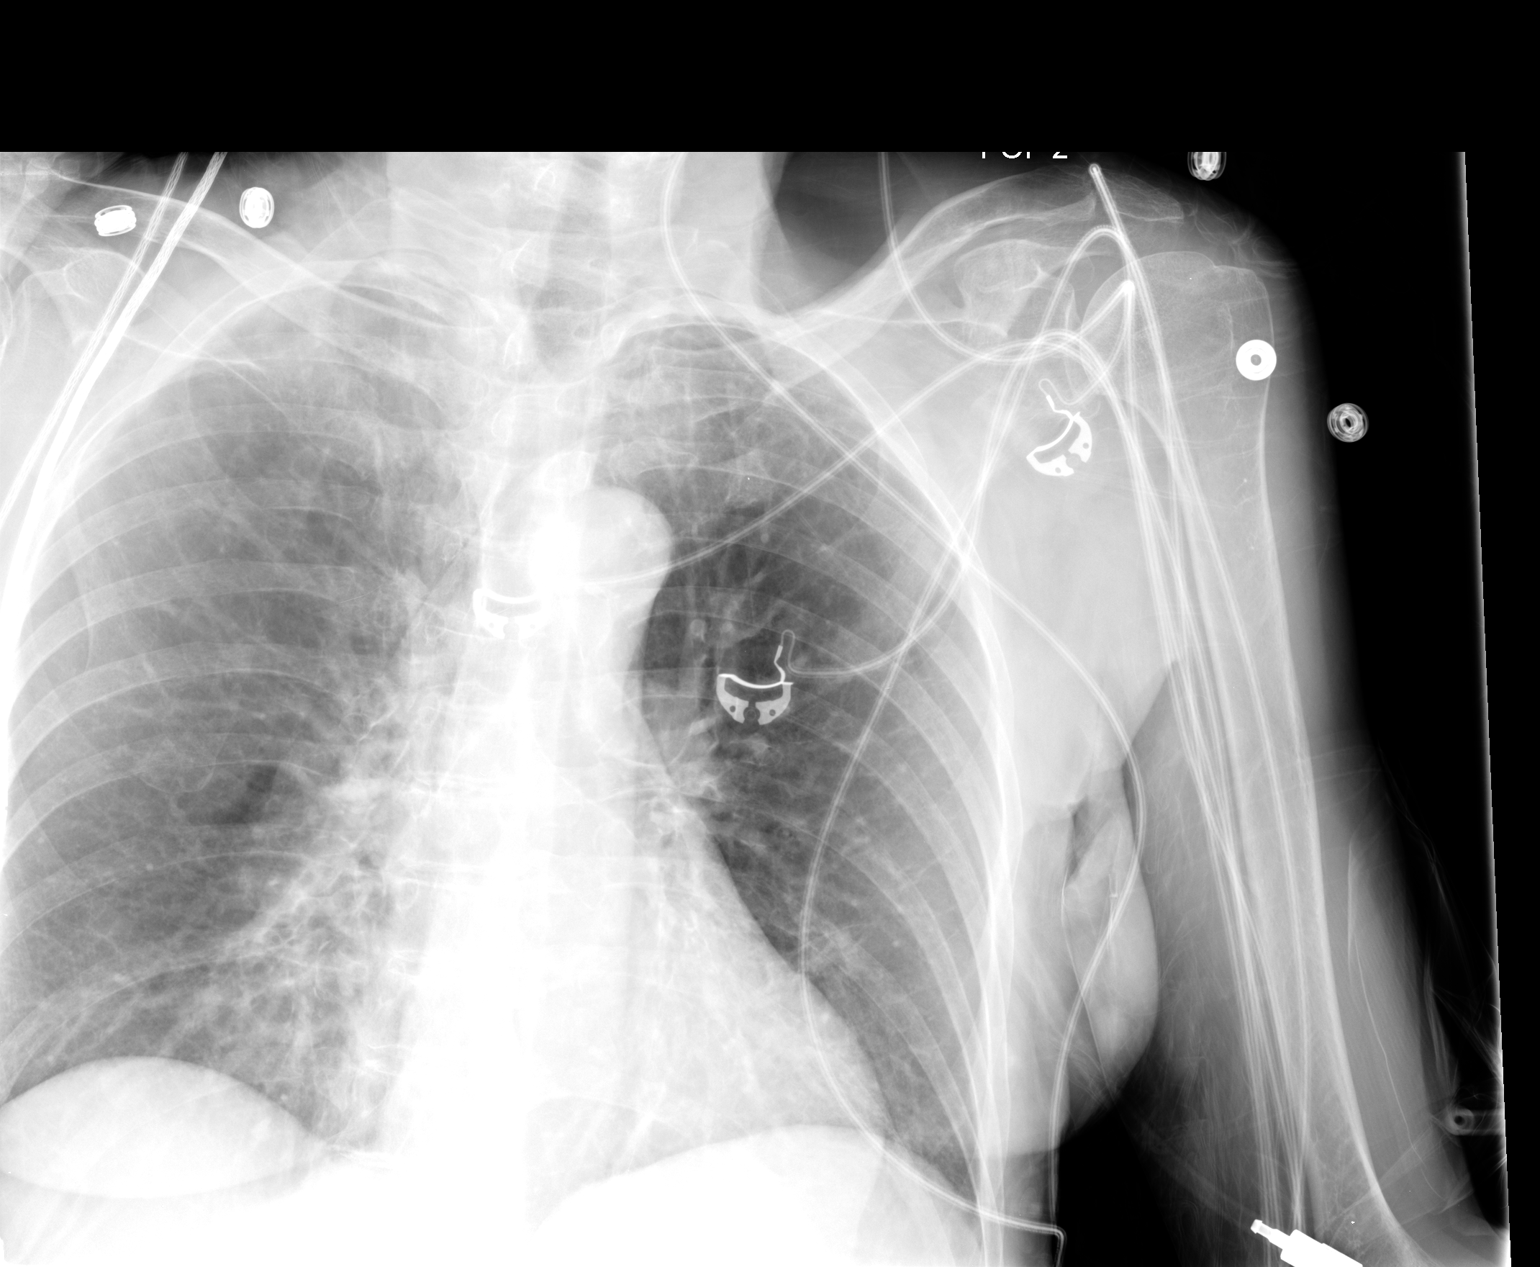

[view not recorded (2 of 2)]
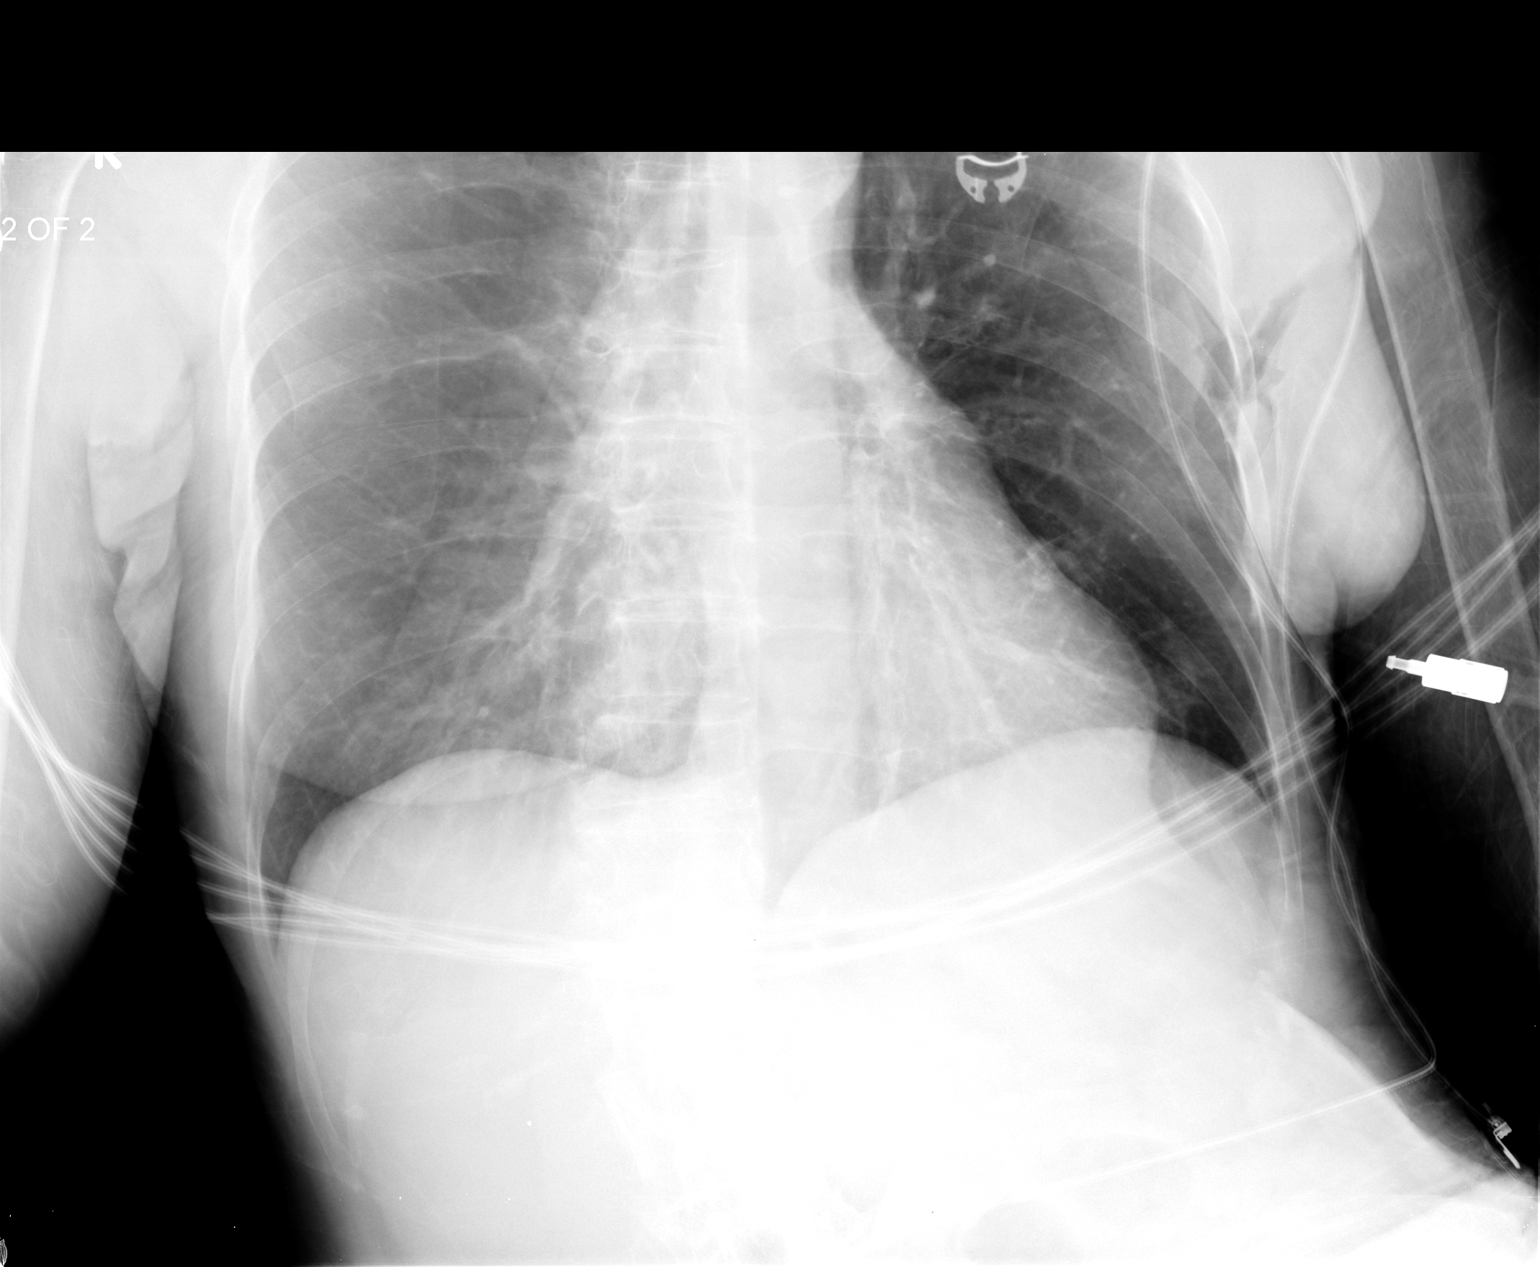

[2 of 2 positions shown; findings below may reference images not displayed]

FINDINGS: Mediastinum hilar structures are normal. Heart size normal. Normal
pulmonary vascularity. Possible COPD. No evidence of pleural
effusion or pneumothorax. No acute bony abnormality identified.
IMPRESSION: 1. No acute abnormality.
2. Possible COPD.

## 2016-09-29 IMAGING — DX DG PELVIS 1-2V
1 series · 1 of 1 positions shown · non-contrast
Comparison: 11/14/2013

CLINICAL DATA: Fall

EXAM:
PELVIS - 1-2 VIEW

[pelvis ap]
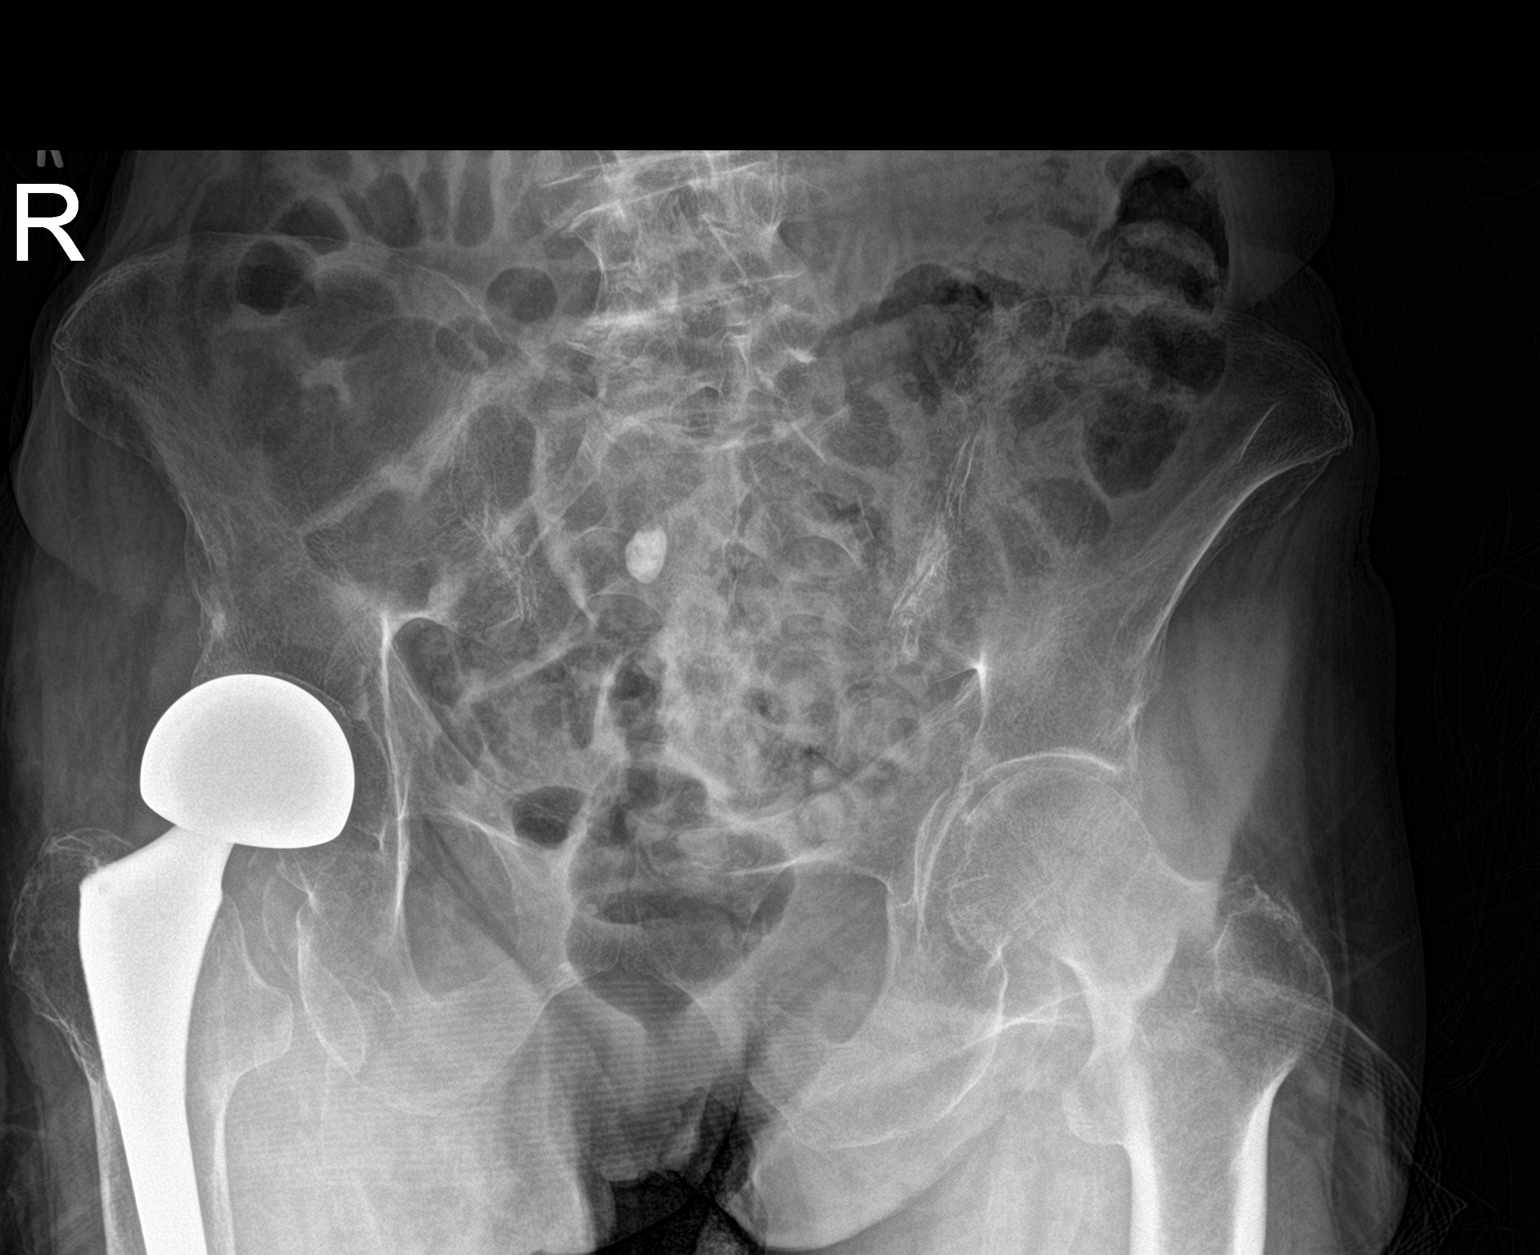

[1 of 1 positions shown; findings below may reference images not displayed]

FINDINGS: Right hip replacement. Normal alignment of both hip joints. Negative
for pelvic fracture. Osteopenia with decreased bone mineralization.
IMPRESSION: Negative for fracture.
